# Patient Record
Sex: Female | Born: 1935 | Race: Black or African American | Hispanic: No
Health system: Southern US, Community
[De-identification: ages and names within clinical notes are randomized; demographics above are authoritative.]

---

## 1998-08-06 ENCOUNTER — Observation Stay (HOSPITAL_COMMUNITY): Admission: EM | Admit: 1998-08-06 | Discharge: 1998-08-07 | Payer: Self-pay | Admitting: *Deleted

## 2001-03-26 ENCOUNTER — Ambulatory Visit (HOSPITAL_COMMUNITY): Admission: RE | Admit: 2001-03-26 | Discharge: 2001-03-26 | Payer: Self-pay | Admitting: Family Medicine

## 2001-03-26 ENCOUNTER — Encounter: Payer: Self-pay | Admitting: Family Medicine

## 2001-05-24 ENCOUNTER — Ambulatory Visit (HOSPITAL_COMMUNITY): Admission: RE | Admit: 2001-05-24 | Discharge: 2001-05-24 | Payer: Self-pay | Admitting: Family Medicine

## 2001-05-24 ENCOUNTER — Encounter: Payer: Self-pay | Admitting: Family Medicine

## 2002-08-09 ENCOUNTER — Emergency Department (HOSPITAL_COMMUNITY): Admission: EM | Admit: 2002-08-09 | Discharge: 2002-08-09 | Payer: Self-pay | Admitting: *Deleted

## 2002-09-16 ENCOUNTER — Encounter: Payer: Self-pay | Admitting: Family Medicine

## 2002-09-16 ENCOUNTER — Ambulatory Visit (HOSPITAL_COMMUNITY): Admission: RE | Admit: 2002-09-16 | Discharge: 2002-09-16 | Payer: Self-pay | Admitting: Family Medicine

## 2002-09-22 ENCOUNTER — Inpatient Hospital Stay (HOSPITAL_COMMUNITY): Admission: AD | Admit: 2002-09-22 | Discharge: 2002-10-01 | Payer: Self-pay | Admitting: Family Medicine

## 2002-09-22 ENCOUNTER — Encounter: Payer: Self-pay | Admitting: Family Medicine

## 2002-10-21 ENCOUNTER — Encounter (HOSPITAL_COMMUNITY): Admission: RE | Admit: 2002-10-21 | Discharge: 2002-11-20 | Payer: Self-pay | Admitting: Family Medicine

## 2002-11-20 ENCOUNTER — Encounter (HOSPITAL_COMMUNITY): Admission: RE | Admit: 2002-11-20 | Discharge: 2002-12-20 | Payer: Self-pay | Admitting: Family Medicine

## 2002-12-26 ENCOUNTER — Ambulatory Visit (HOSPITAL_COMMUNITY): Admission: RE | Admit: 2002-12-26 | Discharge: 2002-12-26 | Payer: Self-pay | Admitting: *Deleted

## 2002-12-30 ENCOUNTER — Ambulatory Visit (HOSPITAL_COMMUNITY): Admission: RE | Admit: 2002-12-30 | Discharge: 2002-12-31 | Payer: Self-pay | Admitting: Internal Medicine

## 2003-01-27 ENCOUNTER — Emergency Department (HOSPITAL_COMMUNITY): Admission: EM | Admit: 2003-01-27 | Discharge: 2003-01-27 | Payer: Self-pay | Admitting: *Deleted

## 2003-02-18 ENCOUNTER — Encounter: Admission: RE | Admit: 2003-02-18 | Discharge: 2003-05-19 | Payer: Self-pay | Admitting: Family Medicine

## 2005-04-27 ENCOUNTER — Ambulatory Visit (HOSPITAL_COMMUNITY): Admission: RE | Admit: 2005-04-27 | Discharge: 2005-04-27 | Payer: Self-pay | Admitting: Family Medicine

## 2005-10-06 ENCOUNTER — Ambulatory Visit: Payer: Self-pay | Admitting: *Deleted

## 2005-10-16 ENCOUNTER — Ambulatory Visit: Payer: Self-pay | Admitting: *Deleted

## 2005-10-16 ENCOUNTER — Encounter (HOSPITAL_COMMUNITY): Admission: RE | Admit: 2005-10-16 | Discharge: 2005-11-15 | Payer: Self-pay | Admitting: *Deleted

## 2005-10-18 ENCOUNTER — Ambulatory Visit: Payer: Self-pay | Admitting: *Deleted

## 2006-04-23 ENCOUNTER — Ambulatory Visit (HOSPITAL_COMMUNITY): Admission: RE | Admit: 2006-04-23 | Discharge: 2006-04-23 | Payer: Self-pay | Admitting: Family Medicine

## 2006-04-28 ENCOUNTER — Inpatient Hospital Stay (HOSPITAL_COMMUNITY): Admission: EM | Admit: 2006-04-28 | Discharge: 2006-05-14 | Payer: Self-pay | Admitting: Emergency Medicine

## 2006-04-28 ENCOUNTER — Ambulatory Visit: Payer: Self-pay | Admitting: Cardiology

## 2006-05-16 ENCOUNTER — Encounter (HOSPITAL_COMMUNITY): Admission: RE | Admit: 2006-05-16 | Discharge: 2006-06-15 | Payer: Self-pay | Admitting: Orthopaedic Surgery

## 2006-12-03 ENCOUNTER — Other Ambulatory Visit: Admission: RE | Admit: 2006-12-03 | Discharge: 2006-12-03 | Payer: Self-pay | Admitting: *Deleted

## 2006-12-03 ENCOUNTER — Encounter (INDEPENDENT_AMBULATORY_CARE_PROVIDER_SITE_OTHER): Payer: Self-pay | Admitting: *Deleted

## 2007-03-13 ENCOUNTER — Other Ambulatory Visit: Admission: RE | Admit: 2007-03-13 | Discharge: 2007-03-13 | Payer: Self-pay | Admitting: Family Medicine

## 2007-05-08 ENCOUNTER — Ambulatory Visit: Payer: Self-pay | Admitting: Cardiology

## 2007-06-07 ENCOUNTER — Ambulatory Visit: Payer: Self-pay | Admitting: Cardiology

## 2007-06-28 ENCOUNTER — Encounter (INDEPENDENT_AMBULATORY_CARE_PROVIDER_SITE_OTHER): Payer: Self-pay | Admitting: Obstetrics and Gynecology

## 2007-06-28 ENCOUNTER — Ambulatory Visit (HOSPITAL_COMMUNITY): Admission: RE | Admit: 2007-06-28 | Discharge: 2007-06-28 | Payer: Self-pay | Admitting: Obstetrics and Gynecology

## 2007-08-19 ENCOUNTER — Inpatient Hospital Stay (HOSPITAL_COMMUNITY): Admission: EM | Admit: 2007-08-19 | Discharge: 2007-08-23 | Payer: Self-pay | Admitting: Emergency Medicine

## 2007-10-07 ENCOUNTER — Ambulatory Visit: Payer: Self-pay | Admitting: Cardiology

## 2007-10-07 ENCOUNTER — Inpatient Hospital Stay (HOSPITAL_COMMUNITY): Admission: EM | Admit: 2007-10-07 | Discharge: 2007-10-14 | Payer: Self-pay | Admitting: Emergency Medicine

## 2007-10-08 ENCOUNTER — Encounter: Payer: Self-pay | Admitting: Cardiology

## 2007-10-23 ENCOUNTER — Ambulatory Visit: Payer: Self-pay | Admitting: Cardiology

## 2007-11-03 ENCOUNTER — Inpatient Hospital Stay (HOSPITAL_COMMUNITY): Admission: EM | Admit: 2007-11-03 | Discharge: 2007-11-08 | Payer: Self-pay | Admitting: Emergency Medicine

## 2007-11-03 ENCOUNTER — Ambulatory Visit: Payer: Self-pay | Admitting: Cardiology

## 2007-11-27 ENCOUNTER — Inpatient Hospital Stay (HOSPITAL_COMMUNITY): Admission: EM | Admit: 2007-11-27 | Discharge: 2007-11-29 | Payer: Self-pay | Admitting: Emergency Medicine

## 2007-11-27 ENCOUNTER — Ambulatory Visit: Payer: Self-pay | Admitting: Cardiology

## 2007-12-18 ENCOUNTER — Ambulatory Visit (HOSPITAL_COMMUNITY): Admission: RE | Admit: 2007-12-18 | Discharge: 2007-12-18 | Payer: Self-pay | Admitting: Cardiology

## 2007-12-18 ENCOUNTER — Ambulatory Visit: Payer: Self-pay | Admitting: Cardiology

## 2008-02-24 ENCOUNTER — Inpatient Hospital Stay (HOSPITAL_COMMUNITY): Admission: EM | Admit: 2008-02-24 | Discharge: 2008-02-27 | Payer: Self-pay | Admitting: Emergency Medicine

## 2008-02-24 ENCOUNTER — Ambulatory Visit: Payer: Self-pay | Admitting: Internal Medicine

## 2008-03-17 ENCOUNTER — Ambulatory Visit: Payer: Self-pay | Admitting: *Deleted

## 2008-03-18 ENCOUNTER — Inpatient Hospital Stay (HOSPITAL_COMMUNITY): Admission: EM | Admit: 2008-03-18 | Discharge: 2008-03-18 | Payer: Self-pay | Admitting: Emergency Medicine

## 2008-03-21 ENCOUNTER — Emergency Department (HOSPITAL_COMMUNITY): Admission: EM | Admit: 2008-03-21 | Discharge: 2008-03-21 | Payer: Self-pay | Admitting: Emergency Medicine

## 2008-03-26 ENCOUNTER — Ambulatory Visit: Payer: Self-pay

## 2008-03-30 ENCOUNTER — Ambulatory Visit: Payer: Self-pay | Admitting: Cardiology

## 2008-05-20 ENCOUNTER — Ambulatory Visit: Payer: Self-pay | Admitting: Cardiology

## 2008-05-20 ENCOUNTER — Inpatient Hospital Stay (HOSPITAL_COMMUNITY): Admission: EM | Admit: 2008-05-20 | Discharge: 2008-05-28 | Payer: Self-pay | Admitting: Emergency Medicine

## 2008-05-22 ENCOUNTER — Encounter: Payer: Self-pay | Admitting: Internal Medicine

## 2008-06-09 ENCOUNTER — Ambulatory Visit: Payer: Self-pay | Admitting: Cardiology

## 2008-06-26 ENCOUNTER — Ambulatory Visit: Payer: Self-pay | Admitting: Internal Medicine

## 2008-06-26 ENCOUNTER — Inpatient Hospital Stay (HOSPITAL_COMMUNITY): Admission: EM | Admit: 2008-06-26 | Discharge: 2008-07-22 | Payer: Self-pay | Admitting: Emergency Medicine

## 2008-06-29 DIAGNOSIS — E109 Type 1 diabetes mellitus without complications: Secondary | ICD-10-CM | POA: Insufficient documentation

## 2008-06-29 DIAGNOSIS — I10 Essential (primary) hypertension: Secondary | ICD-10-CM | POA: Insufficient documentation

## 2008-06-29 DIAGNOSIS — F068 Other specified mental disorders due to known physiological condition: Secondary | ICD-10-CM | POA: Insufficient documentation

## 2008-06-29 DIAGNOSIS — E785 Hyperlipidemia, unspecified: Secondary | ICD-10-CM | POA: Insufficient documentation

## 2008-06-29 DIAGNOSIS — E039 Hypothyroidism, unspecified: Secondary | ICD-10-CM | POA: Insufficient documentation

## 2008-06-29 DIAGNOSIS — M1990 Unspecified osteoarthritis, unspecified site: Secondary | ICD-10-CM | POA: Insufficient documentation

## 2008-07-01 ENCOUNTER — Encounter (INDEPENDENT_AMBULATORY_CARE_PROVIDER_SITE_OTHER): Payer: Self-pay | Admitting: Internal Medicine

## 2008-07-01 ENCOUNTER — Ambulatory Visit: Payer: Self-pay | Admitting: Vascular Surgery

## 2008-08-14 ENCOUNTER — Ambulatory Visit: Payer: Self-pay | Admitting: Cardiology

## 2008-09-02 ENCOUNTER — Inpatient Hospital Stay (HOSPITAL_COMMUNITY): Admission: RE | Admit: 2008-09-02 | Discharge: 2008-09-09 | Payer: Self-pay | Admitting: General Surgery

## 2008-09-03 ENCOUNTER — Encounter (INDEPENDENT_AMBULATORY_CARE_PROVIDER_SITE_OTHER): Payer: Self-pay | Admitting: General Surgery

## 2008-09-28 ENCOUNTER — Ambulatory Visit: Payer: Self-pay | Admitting: Cardiology

## 2008-09-28 ENCOUNTER — Encounter (INDEPENDENT_AMBULATORY_CARE_PROVIDER_SITE_OTHER): Payer: Self-pay | Admitting: *Deleted

## 2008-09-28 LAB — CONVERTED CEMR LAB
Alkaline Phosphatase: 118 units/L
BUN: 24 mg/dL
CO2: 23 meq/L
Chloride: 104 meq/L
Creatinine, Ser: 1.24 mg/dL
Glucose, Bld: 108 mg/dL
Total Protein: 6.9 g/dL

## 2008-10-20 ENCOUNTER — Inpatient Hospital Stay (HOSPITAL_COMMUNITY): Admission: EM | Admit: 2008-10-20 | Discharge: 2008-10-23 | Payer: Self-pay | Admitting: Emergency Medicine

## 2008-11-16 ENCOUNTER — Emergency Department (HOSPITAL_COMMUNITY): Admission: EM | Admit: 2008-11-16 | Discharge: 2008-11-16 | Payer: Self-pay | Admitting: Emergency Medicine

## 2009-03-08 ENCOUNTER — Other Ambulatory Visit: Payer: Self-pay | Admitting: Emergency Medicine

## 2009-03-09 ENCOUNTER — Inpatient Hospital Stay (HOSPITAL_COMMUNITY): Admission: EM | Admit: 2009-03-09 | Discharge: 2009-03-11 | Payer: Self-pay | Admitting: Emergency Medicine

## 2009-03-09 ENCOUNTER — Ambulatory Visit: Payer: Self-pay | Admitting: Cardiology

## 2009-03-09 ENCOUNTER — Ambulatory Visit: Payer: Self-pay | Admitting: Internal Medicine

## 2009-06-17 ENCOUNTER — Ambulatory Visit: Payer: Self-pay | Admitting: Family Medicine

## 2009-06-17 ENCOUNTER — Inpatient Hospital Stay (HOSPITAL_COMMUNITY): Admission: EM | Admit: 2009-06-17 | Discharge: 2009-06-20 | Payer: Self-pay | Admitting: Emergency Medicine

## 2009-06-17 ENCOUNTER — Ambulatory Visit: Payer: Self-pay | Admitting: Cardiology

## 2009-06-18 ENCOUNTER — Encounter: Payer: Self-pay | Admitting: Family Medicine

## 2009-06-18 ENCOUNTER — Encounter (INDEPENDENT_AMBULATORY_CARE_PROVIDER_SITE_OTHER): Payer: Self-pay | Admitting: *Deleted

## 2009-06-18 LAB — CONVERTED CEMR LAB
Hgb A1c MFr Bld: 7.3 %
TSH: 11.713 microintl units/mL

## 2009-06-24 ENCOUNTER — Encounter (INDEPENDENT_AMBULATORY_CARE_PROVIDER_SITE_OTHER): Payer: Self-pay | Admitting: *Deleted

## 2009-06-28 ENCOUNTER — Ambulatory Visit: Payer: Self-pay | Admitting: Cardiology

## 2009-06-28 DIAGNOSIS — I4891 Unspecified atrial fibrillation: Secondary | ICD-10-CM | POA: Insufficient documentation

## 2009-06-28 DIAGNOSIS — I739 Peripheral vascular disease, unspecified: Secondary | ICD-10-CM | POA: Insufficient documentation

## 2009-07-05 ENCOUNTER — Ambulatory Visit (HOSPITAL_COMMUNITY): Admission: RE | Admit: 2009-07-05 | Discharge: 2009-07-05 | Payer: Self-pay | Admitting: Cardiology

## 2009-07-18 IMAGING — CR DG CHEST 1V PORT
1 series · 1 of 1 positions shown · non-contrast
Comparison: 07/01/2008

CLINICAL DATA: Right-sided PICC line placement.  Diabetes.

PORTABLE CHEST - 1 VIEW

[view not recorded]
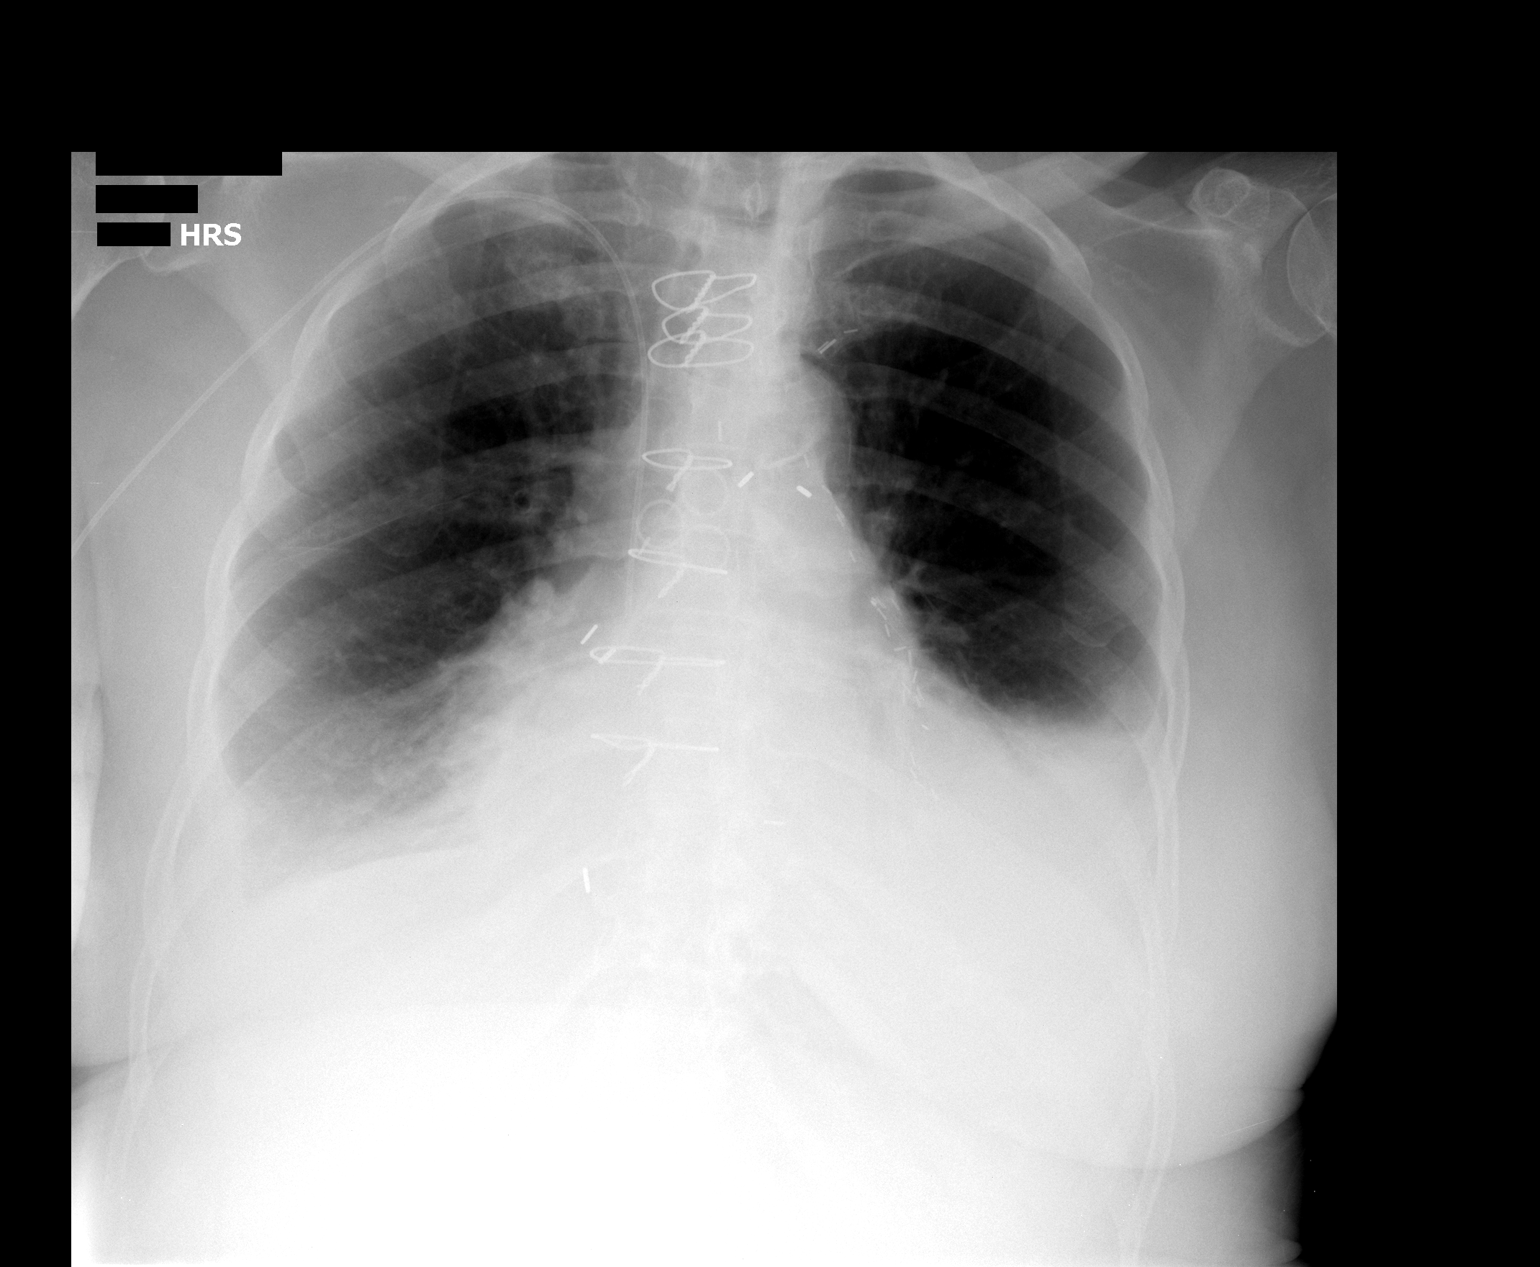

[1 of 1 positions shown; findings below may reference images not displayed]

FINDINGS: Moderate left and small right pleural effusions are
present with associated passive atelectasis.  Prior CABG noted with
cardiomegaly.  No overt edema is present at this time.

Right-sided PICC line noted with tip projecting over the superior
vena cava.

There is atherosclerosis of the aortic arch.
IMPRESSION: 1.  Right-sided PICC line tip:  SVC.  No pneumothorax.
2.  Moderate left and small right pleural effusions with passive
atelectasis.
3.  Cardiomegaly but no current pulmonary edema identified.

## 2009-07-23 ENCOUNTER — Encounter (INDEPENDENT_AMBULATORY_CARE_PROVIDER_SITE_OTHER): Payer: Self-pay | Admitting: *Deleted

## 2009-07-23 LAB — CONVERTED CEMR LAB
Hemoglobin: 9.4 g/dL
Hemoglobin: 9.4 g/dL
MCV: 92 fL
Platelets: 283 10*3/uL
Platelets: 283 10*3/uL
TSH: 4.32 microintl units/mL
WBC: 3.2 10*3/uL

## 2009-08-30 ENCOUNTER — Ambulatory Visit: Payer: Self-pay | Admitting: Cardiology

## 2009-08-31 ENCOUNTER — Encounter: Payer: Self-pay | Admitting: Cardiology

## 2009-09-07 ENCOUNTER — Inpatient Hospital Stay (HOSPITAL_COMMUNITY): Admission: EM | Admit: 2009-09-07 | Discharge: 2009-09-10 | Payer: Self-pay | Admitting: Emergency Medicine

## 2009-09-22 ENCOUNTER — Ambulatory Visit: Payer: Self-pay | Admitting: Gastroenterology

## 2009-09-30 ENCOUNTER — Encounter (INDEPENDENT_AMBULATORY_CARE_PROVIDER_SITE_OTHER): Payer: Self-pay | Admitting: Internal Medicine

## 2009-09-30 ENCOUNTER — Ambulatory Visit: Payer: Self-pay | Admitting: Cardiology

## 2009-11-14 DEATH — deceased

## 2010-06-23 ENCOUNTER — Inpatient Hospital Stay (HOSPITAL_COMMUNITY): Admission: EM | Admit: 2010-06-23 | Discharge: 2009-10-15 | Payer: Self-pay | Admitting: Emergency Medicine

## 2010-06-23 ENCOUNTER — Encounter (INDEPENDENT_AMBULATORY_CARE_PROVIDER_SITE_OTHER): Payer: Self-pay | Admitting: *Deleted

## 2010-07-04 IMAGING — US US CAROTID DUPLEX BILAT
1 series · 13 of 24 positions shown · non-contrast
Comparison: None.

CLINICAL DATA: Hypertension, diabetes, heart disease status post MI
and CABG, carotid bruit

BILATERAL CAROTID DUPLEX ULTRASOUND
TECHNIQUE: Gray scale imaging, color Doppler and duplex ultrasound
was performed of bilateral carotid and vertebral arteries in the
neck.

[Series 1: unknown · 0.07mm/px · 13 of 60 slices shown]
[im 1/60]
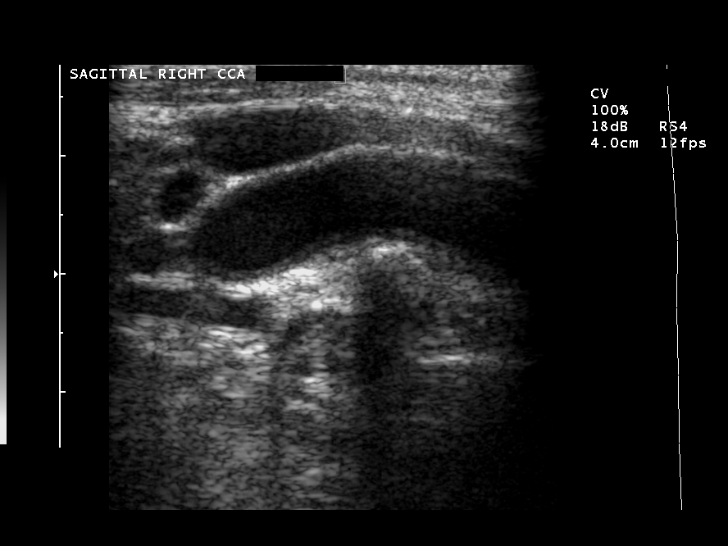
[im 6/60]
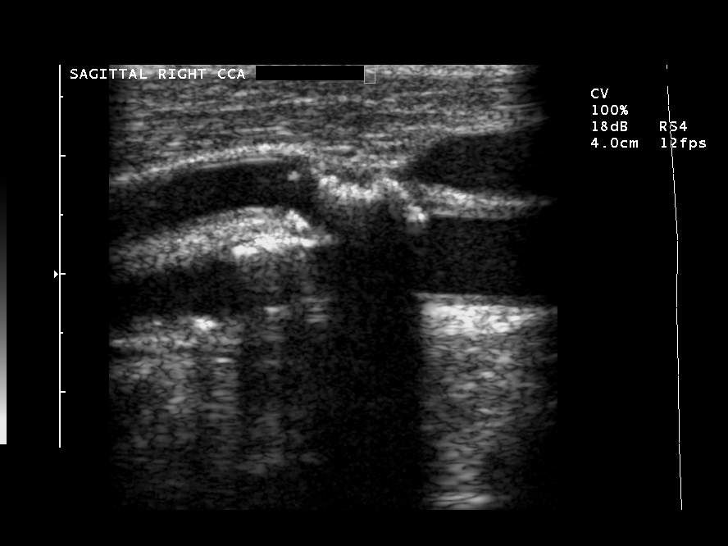
[im 11/60]
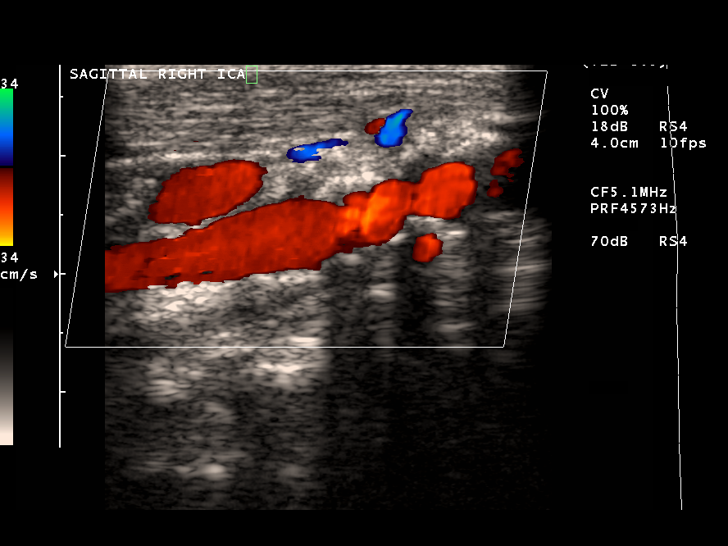
[im 16/60]
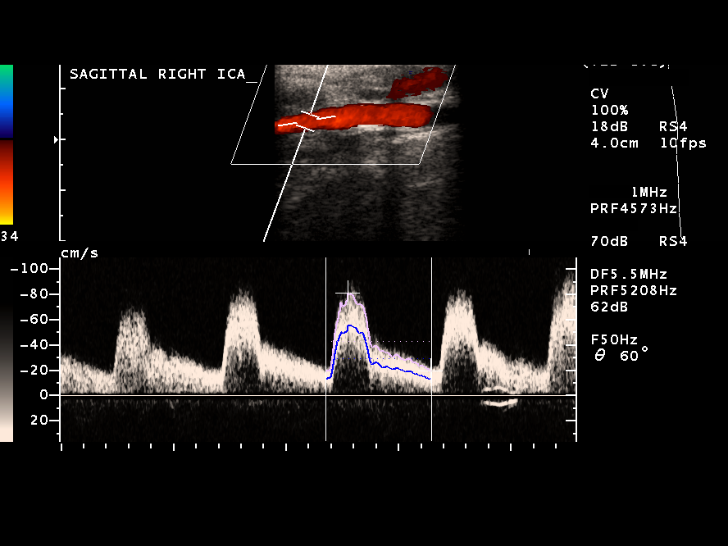
[im 21/60]
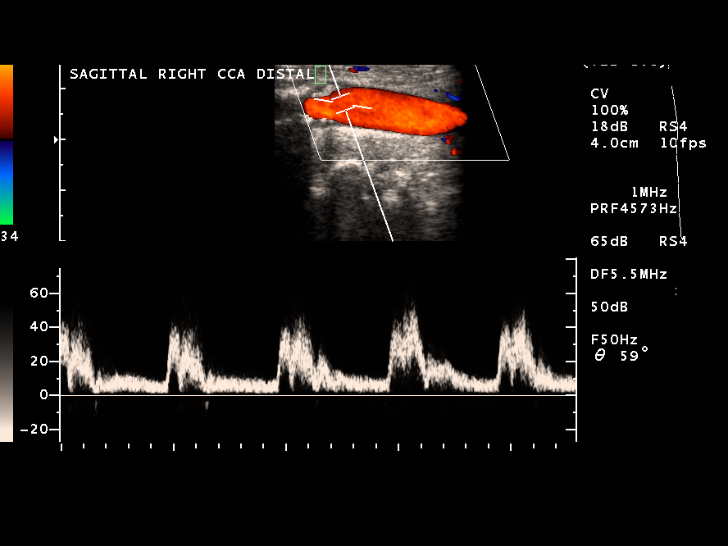
[im 26/60]
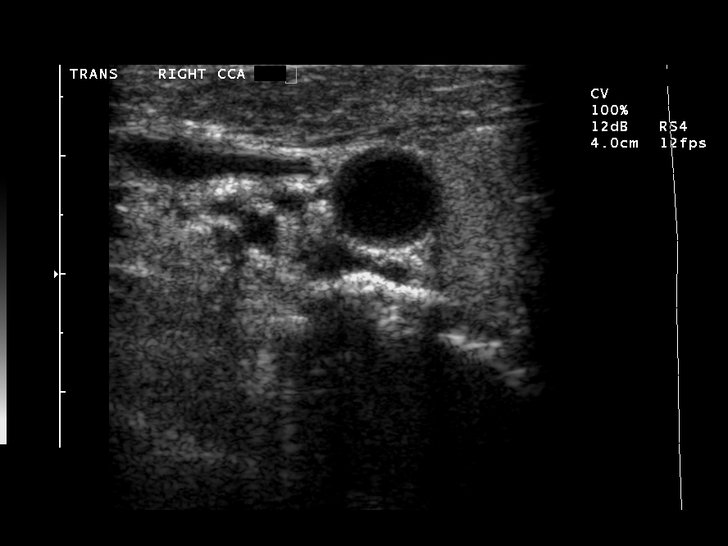
[im 31/60]
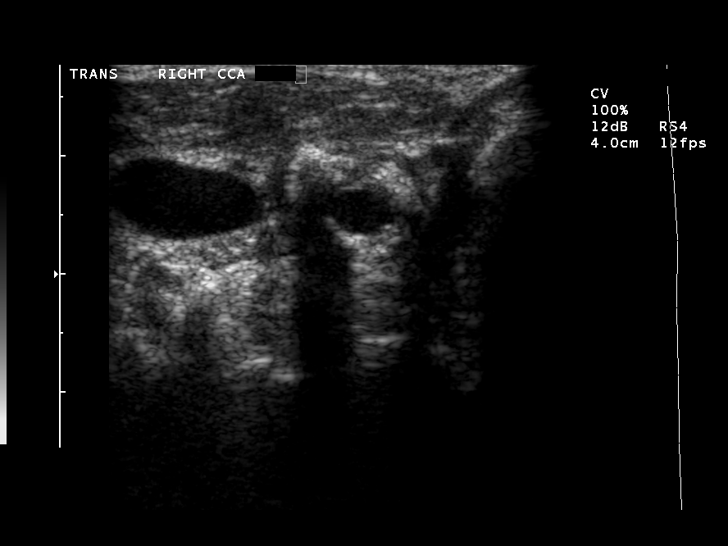
[im 34/60]
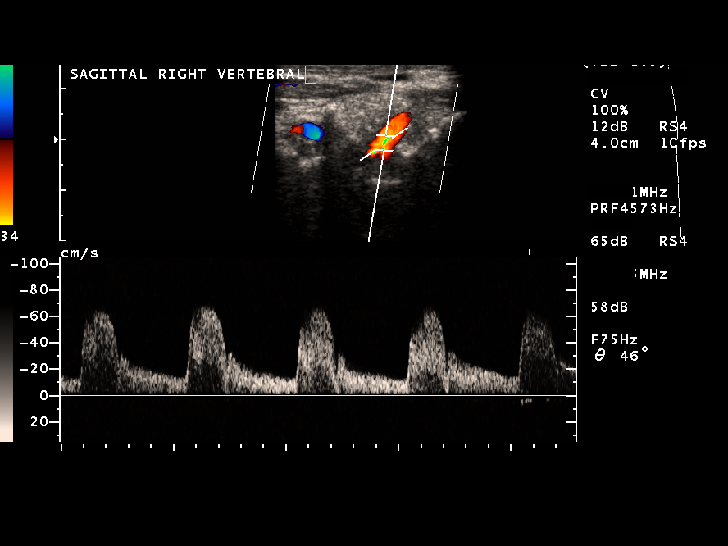
[im 39/60]
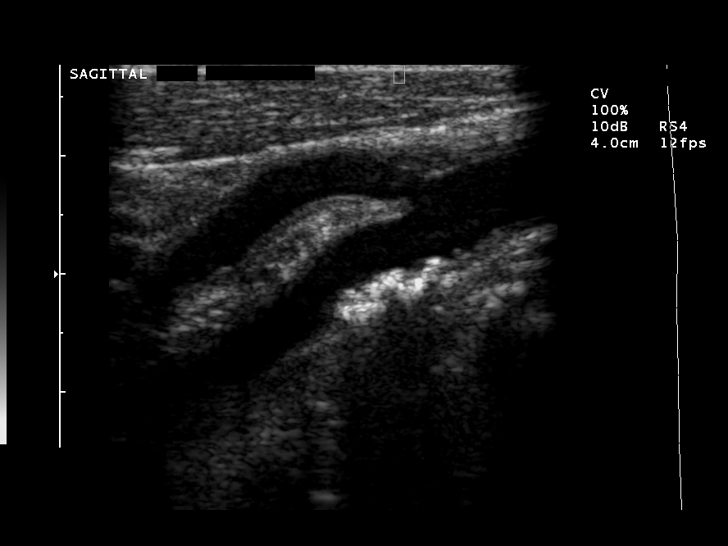
[im 44/60]
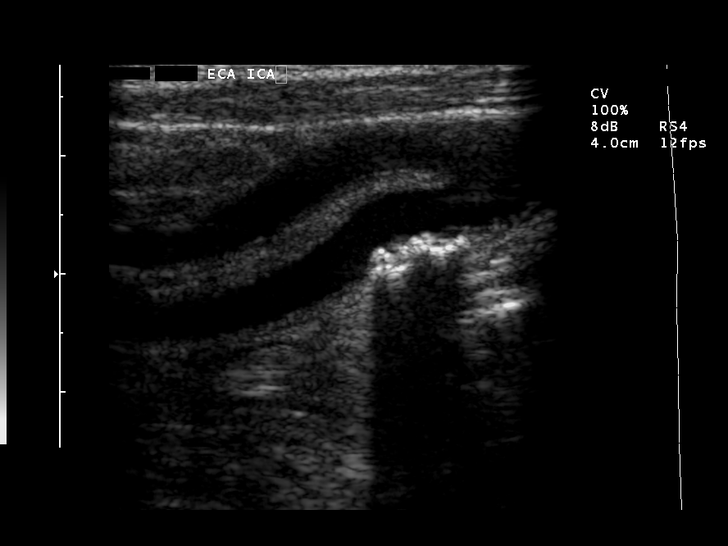
[im 49/60]
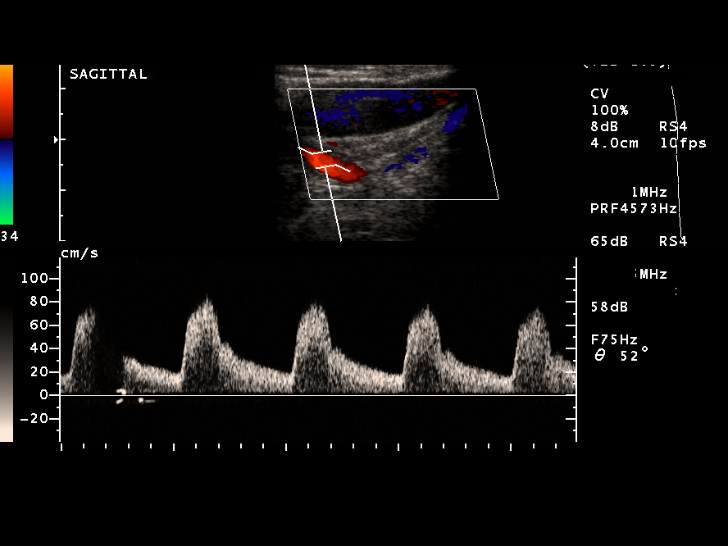
[im 54/60]
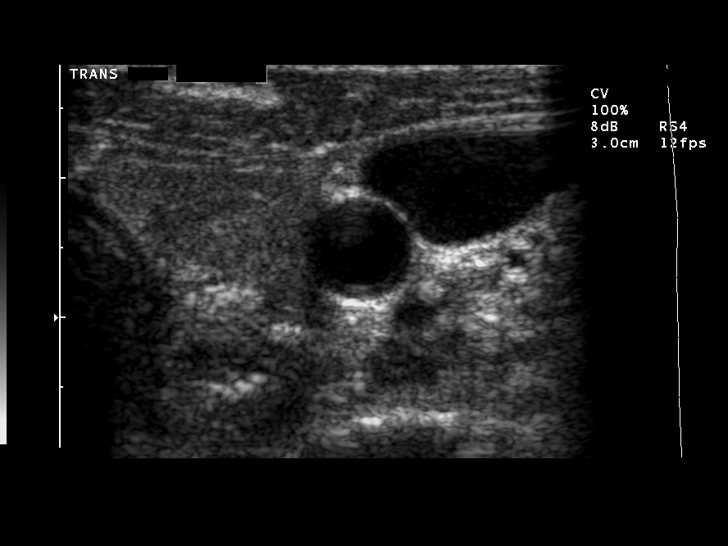
[im 60/60]
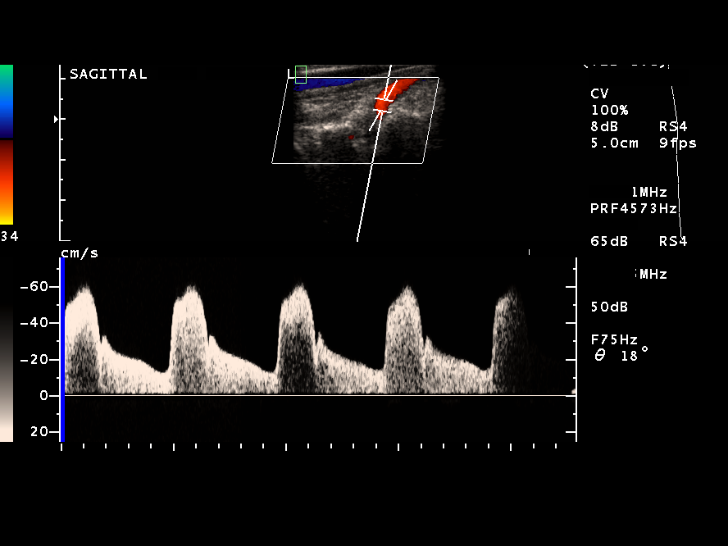

[13 of 24 positions shown; findings below may reference images not displayed]

Criteria:  Quantification of carotid stenosis is based on velocity
parameters that correlate the residual internal carotid diameter
with NASCET-based stenosis levels, using the diameter of the distal
internal carotid lumen as the denominator for stenosis measurement.

The following velocity measurements were obtained:

                 PEAK SYSTOLIC/END DIASTOLIC
RIGHT
ICA:                        84/19cm/sec
CCA:                        54/9cm/sec
SYSTOLIC ICA/CCA RATIO:
DIASTOLIC ICA/CCA RATIO:
ECA:                        128/4cm/sec

LEFT
ICA:                        73/17cm/sec
CCA:                        66/10cm/sec
SYSTOLIC ICA/CCA RATIO:
DIASTOLIC ICA/CCA RATIO:
ECA:                        94/5cm/sec
FINDINGS: RIGHT CAROTID ARTERY: Tortuous right CCA.  Significant plaque at
right carotid bulb into proximal right ECA and ICA, with a
significant portion of this plaque calcified and shadowing.
Turbulent flow identified in proximal right ECA with increased
velocity on color Doppler imaging.  Spectral broadening right ECA
on waveform analysis.  Less turbulence identified in right ICA,
though significant spectral broadening is noted.  No definite high
velocity jet within the right ICA. Gray scale estimation of the
proximal right ICA shows less than 50% diameter narrowing.

RIGHT VERTEBRAL ARTERY:  Patent, antegrade

LEFT CAROTID ARTERY: Mild plaque formation identified left CCA,
left carotid bulb extending into the left internal carotid.
Proximal left ICA plaque is shadowing but does not obscure the
vessel.  Turbulent flow identified within the proximal left ICA and
ECA, both of which are minimally tortuous.  Mild spectral
broadening distal left ICA, noted in an area of tortuosity.  No
definite high velocity jets.

LEFT VERTEBRAL ARTERY:  Patent, antegrade
IMPRESSION: Plaque formation bilaterally at the carotid bifurcations,
significant portions of which are calcified, with velocities
corresponding to a less than 50% diameter stenoses.
Turbulent and increased velocity flow in the proximal right ECA
suggests mild stenosis.
No definite hemodynamically significant common carotid or internal
carotid artery stenosis identified.

## 2010-08-07 ENCOUNTER — Encounter: Payer: Self-pay | Admitting: Cardiology

## 2010-08-07 ENCOUNTER — Encounter: Payer: Self-pay | Admitting: *Deleted

## 2010-08-16 NOTE — Letter (Signed)
Summary: Appointment - Reminder 2  [facility] [place]. [address], [place] [address]   Phone: [phone]  Fax: [phone]     June 23, 2010 MRN: [id number]   [name] [address] [place], [place]  [address]   Dear Ms. Kasa,  Our records indicate that it is time to schedule a follow-up appointment.  Dr. Dietrich Pates         recommended that you follow up with [place] in   01/2010 PAST DUE         . It is very important that we reach you to schedule this appointment. We look forward to participating in your health care needs. Please contact us at the number listed above at your earliest convenience to schedule your appointment.  If you are unable to make an appointment at this time, give [place] a call so we can update our records.     Sincerely,   Glass blower/designer

## 2010-08-16 NOTE — Letter (Signed)
Summary: BP READINGS  BP READINGS   Imported By: Faythe Ghee 08/31/2009 15:51:37  _____________________________________________________________________  External Attachment:    Type:   Image     Comment:   External Document

## 2010-08-16 NOTE — Assessment & Plan Note (Signed)
Summary: 2 mth nurse visit per checkout on 06/28/09/tg  Nurse Visit   Vital Signs:  Patient profile:   77 year old female Height:      65 inches Weight:      132 pounds O2 Sat:      96 % on Room air Pulse rate:   82 / minute BP sitting:   215 / 100  (right arm)  Vitals Entered By: Teressa Lower RN (August 30, 2009 10:37 AM)  O2 Flow:  Room air  Serial Vital Signs/Assessments:  Time      Position  BP       Pulse  Resp  Temp     By 10:38 AM            212/96   82                    Anjel Perfetti RN   Visit Type:  2 mo. nurse visit Primary Provider:  Dr. Mirna Mires   History of Present Illness: Dyspnea; intermittent chest discomfort Electrocardiogram BP readings from NH  67 readings                                     54 readings  SBP > 150                                    14 readings DBP >85 medications that where d/c'd in NH was done by Dr. Marijo Conception       Current Medications (verified): 1)  Plavix 75 Mg Tabs (Clopidogrel Bisulfate) .... Take 1 By Mouth Qd 2)  Metoprolol Tartrate 25 Mg Tabs (Metoprolol Tartrate) .... Take 1/2  Tablet By Mouth Twice A Day 3)  Namenda 10 Mg Tabs (Memantine Hcl) .... Take 1 Two Times A Day 4)  Synthroid 50 Mcg Tabs (Levothyroxine Sodium) .... Take 1 Tab Daily 5)  Aspir-Low 81 Mg Tbec (Aspirin) .... Take 1 Tab Daily 6)  Ditropan Xl 10 Mg Xr24h-Tab (Oxybutynin Chloride) .... Take 1 Tab Daily 7)  Cardizem Cd 180 Mg Xr24h-Cap (Diltiazem Hcl Coated Beads) .... Take 1 Tab Daily 8)  Ferrous Sulfate 325 (65 Fe) Mg Tabs (Ferrous Sulfate) .... Take 1 Tab Daily 9)  Protonix 40 Mg Tbec (Pantoprazole Sodium) .... Take 1 Tab Daily 10)  Remeron 30 Mg Tabs (Mirtazapine) .... Take 1 Tab At Bedtime 11)  Novolog 100 Unit/ml Soln (Insulin Aspart) .... Sliding Scale 12)  Clonidine Hcl 0.1 Mg Tabs (Clonidine Hcl) .... Take One Tablet By Mouth Two Times A Day Increase To 0.2mg   Two Times A Day If Bp Not Controlled 13)  Aspirin 81 Mg Tbec (Aspirin) ....  Take One Tablet By Mouth Daily 14)  Isosorbide Mononitrate Cr 30 Mg Xr24h-Tab (Isosorbide Mononitrate) .... Take One Tablet By Mouth Daily 15)  Celexa 10 Mg Tabs (Citalopram Hydrobromide) .... Take 3 Tablets By Mouth At Bedtime 16)  Colace 100 Mg Caps (Docusate Sodium) .... Take 1 Tablet By Mouth Two Times A Day 17)  Lantus 100 Unit/ml Soln (Insulin Glargine) .... 5 Units Subcutaneously At Bedtime 18)  Robitussin Dm 100-10 Mg/42ml Syrp (Dextromethorphan-Guaifenesin) .Marland Kitchen.. 10ml As Needed For Cough 19)  Vicodin 5-500 Mg Tabs (Hydrocodone-Acetaminophen) .... As Needed  Allergies (verified): No Known Drug Allergies  Appended Document: 2 mth nurse visit per checkout on 06/28/09/tg  Increase metoprolol to 50 mg b.i.d. Chlorthalidone 12.5 mg once daily Change clonidine to Catapres TTS level II if financially feasible BP check in 3 weeks.  Kalaoa Bing, M.D.  Appended Document: 2 mth nurse visit per checkout on 06/28/09/tg Medications Added METOPROLOL TARTRATE 50 MG TABS (METOPROLOL TARTRATE) take 1 tablet by mouth two times a day CATAPRES-TTS-2 0.2 MG/24HR PTWK (CLONIDINE HCL)  CHLORTHALIDONE 25 MG TABS (CHLORTHALIDONE) take 1/2 tablet by mouth once daily          Phone Note Outgoing Call   Summary of Call: Phone number is out of service, will try to locate an allternate phone number.   Follow-up for Phone Call        Patient now lives at [facility] (phone#= [phone]). I gave verbal orders over the phone and  faxed physician orders to pt's nurse, [facility], at [facility] nursing home @ [phone]. Also, Ive asked Hope to fax [place] pt's BP readings from today until September 20, 2009 along with pt's med list to avoid pt. being transported here for BP check. Nurse, Hope stated she understands info. given. Follow-up by: Larita Fife Via LPN,  September 06, 2009 11:48 AM    New/Updated Medications: METOPROLOL TARTRATE 50 MG TABS (METOPROLOL TARTRATE) take 1 tablet by mouth two times a day CATAPRES-TTS-2  0.2 MG/24HR PTWK (CLONIDINE HCL)  CHLORTHALIDONE 25 MG TABS (CHLORTHALIDONE) take 1/2 tablet by mouth once daily

## 2010-08-16 NOTE — Miscellaneous (Signed)
Summary: cbc,tsh  Clinical Lists Changes  Observations: Added new observation of PLATELETK/UL: 283 K/uL (07/23/2009 10:42) Added new observation of MCV: 92 fL (07/23/2009 10:42) Added new observation of HCT: 29.0 % (07/23/2009 10:42) Added new observation of HGB: 9.4 g/dL (16/04/9603 54:09) Added new observation of WBC COUNT: 3.2 10*3/microliter (07/23/2009 10:42) Added new observation of TSH: 4.320 microintl units/mL (07/23/2009 10:42)

## 2010-09-24 IMAGING — CR DG ABD PORTABLE 1V
1 series · 1 of 1 positions shown · non-contrast
Comparison: 09/24/2009

CLINICAL DATA: Pan colitis

ABDOMEN - 1 VIEW

[AP]
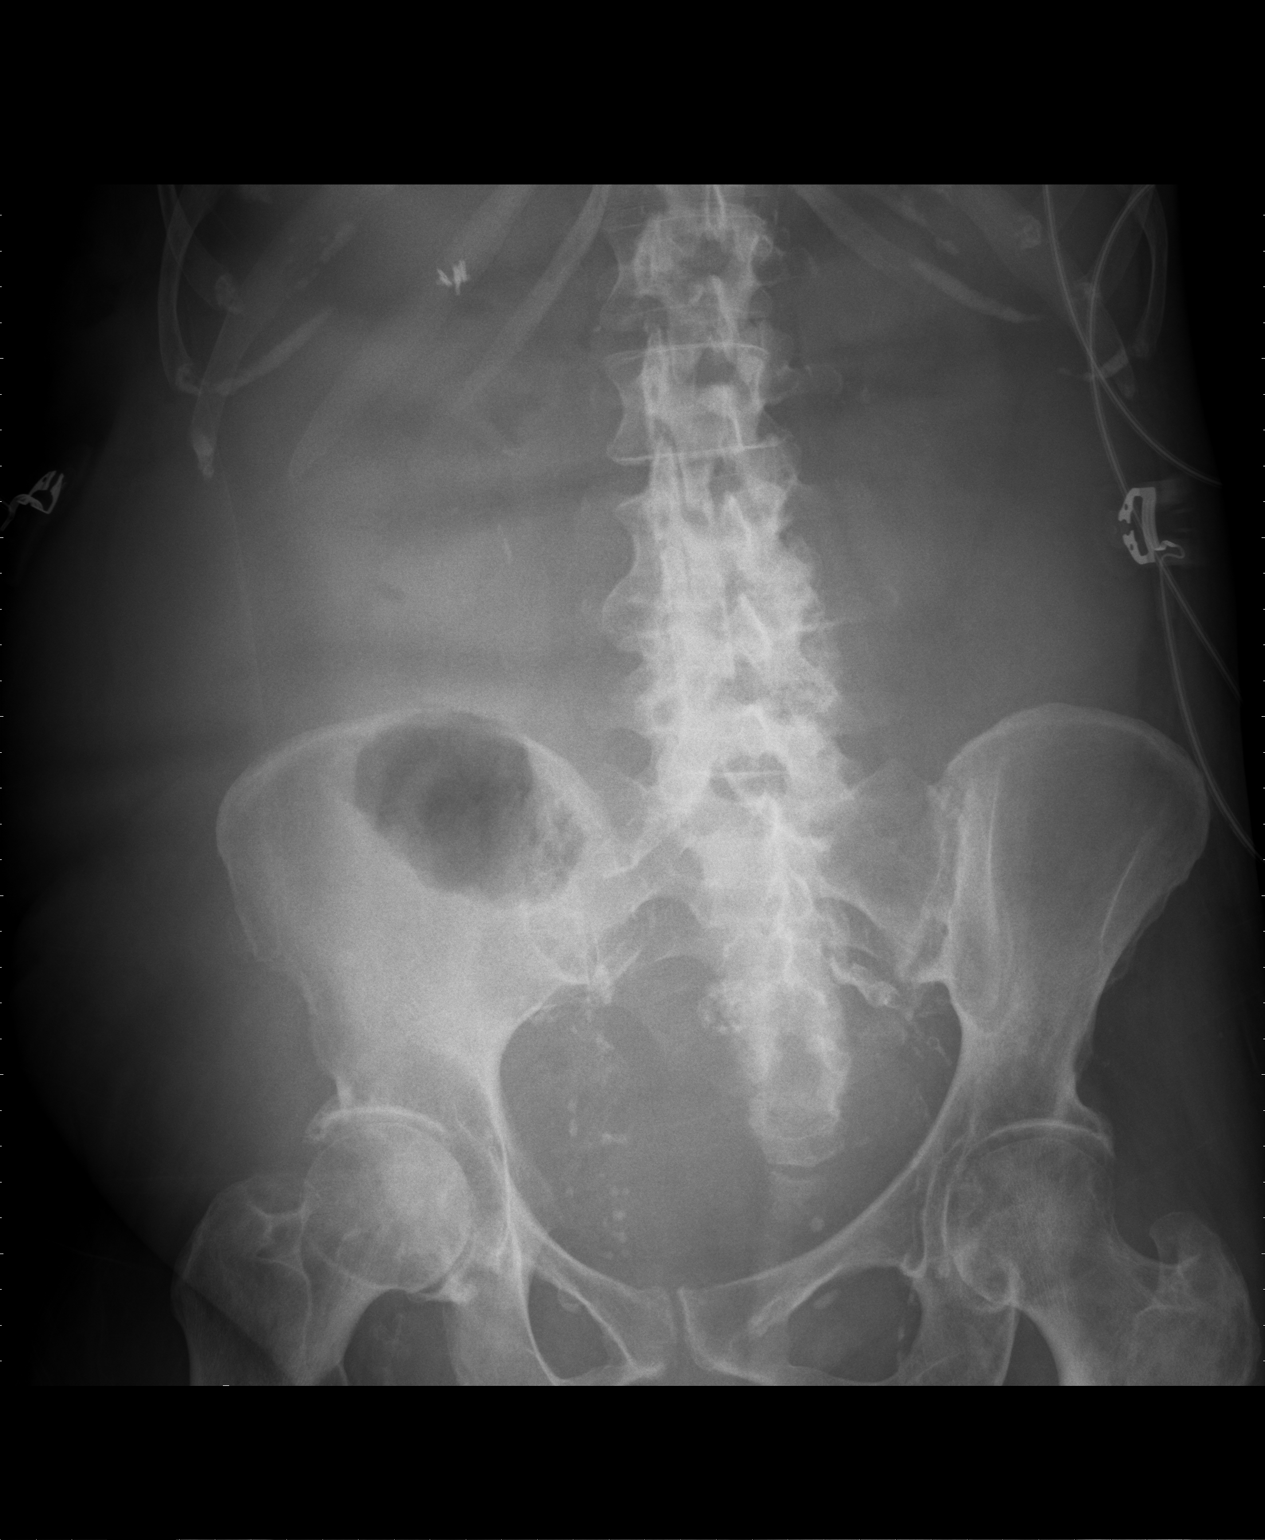

[1 of 1 positions shown; findings below may reference images not displayed]

FINDINGS: Stable appearance of right lower quadrant bowel loop.

There is no evidence for free intraperitoneal air.

Renal calculi and cholecystectomy clips again noted.
IMPRESSION: 1.  No change in the appearance of prominent right lower quadrant
bowel loop

## 2010-09-25 IMAGING — CR DG CHEST 1V PORT
1 series · 1 of 1 positions shown · non-contrast
Comparison: 09/19/2009

CLINICAL DATA: Pain.  Colitis.  The patient is unresponsive.

PORTABLE CHEST - 1 VIEW

[view not recorded]
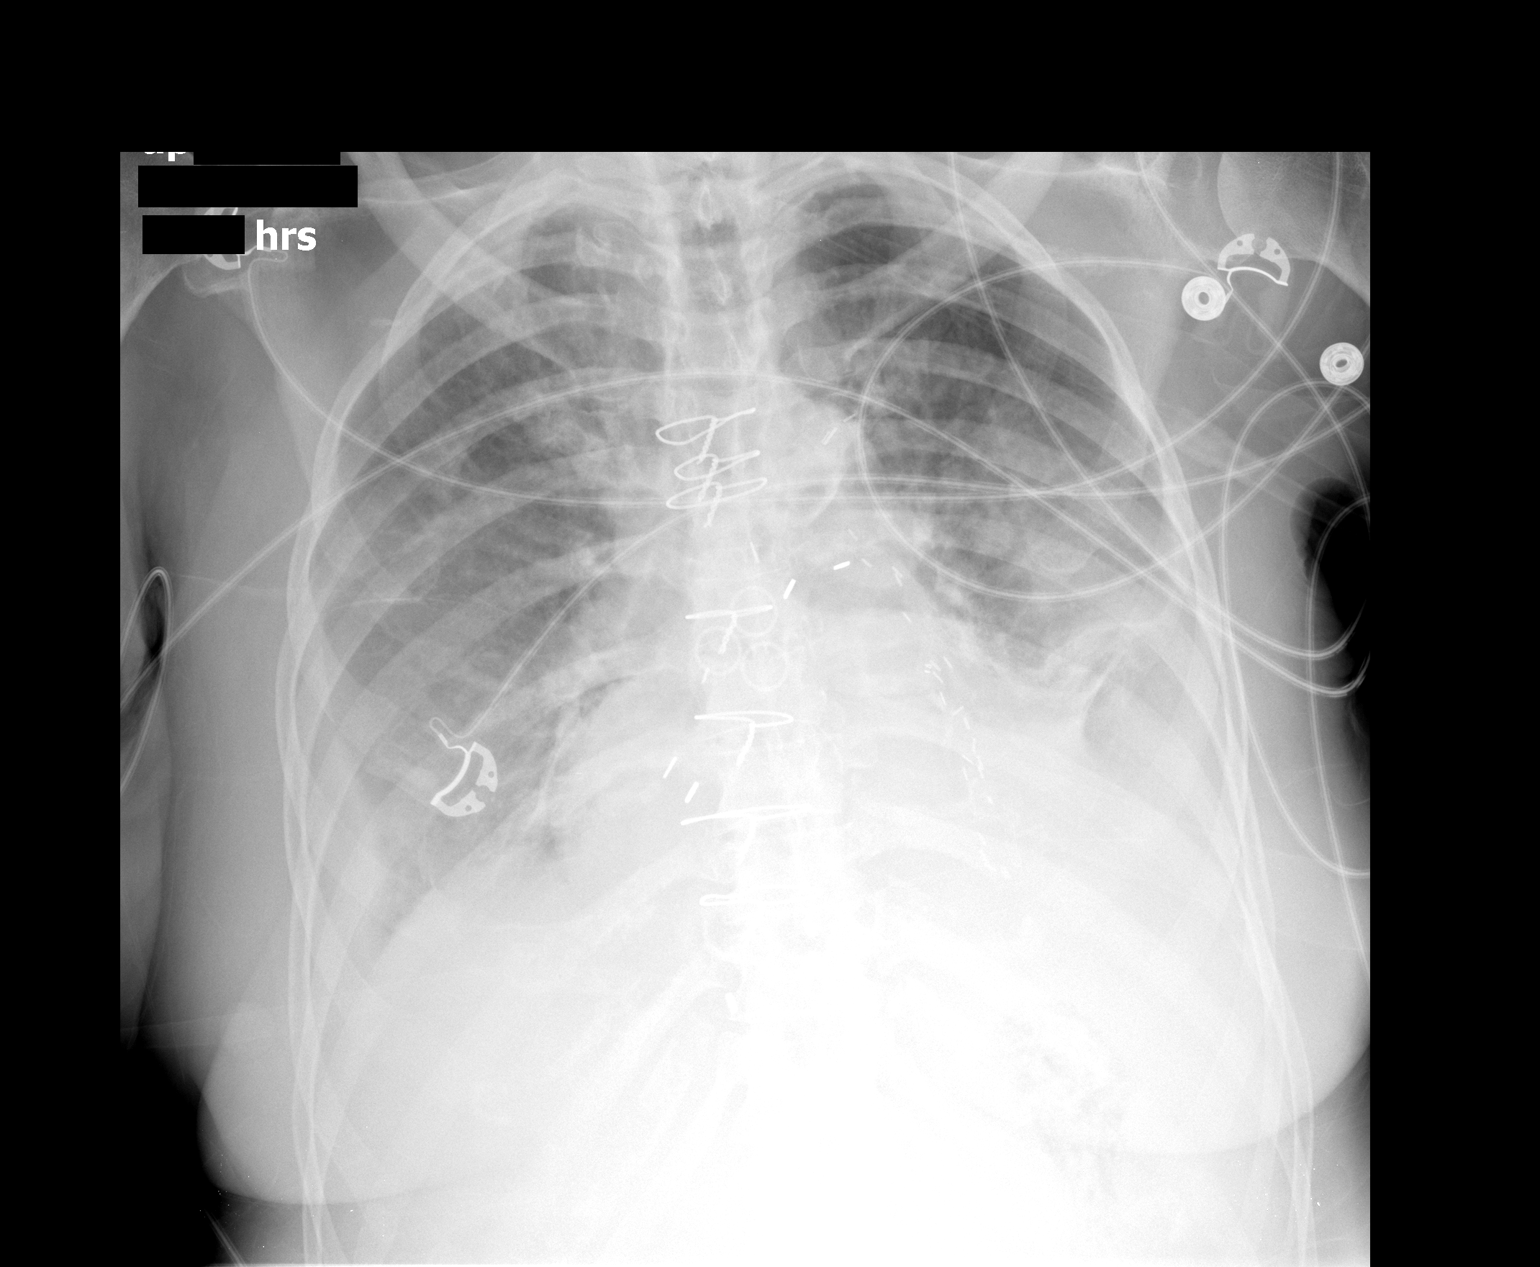

[1 of 1 positions shown; findings below may reference images not displayed]

FINDINGS: Patient has had a median sternotomy and CABG.  Heart is
enlarged.  There is increased pulmonary edema.  There are bilateral
pleural effusions.  Bibasilar opacities have increased since prior
study, consistent with effusions and infiltrates or increased
atelectasis.
IMPRESSION: Increased pulmonary edema and bilateral lower lobe opacities.

## 2010-09-26 IMAGING — CR DG CHEST 1V PORT
1 series · 1 of 1 positions shown · non-contrast
Comparison: 09/26/2009.

CLINICAL DATA: Pneumonia.  Colitis.

PORTABLE CHEST - 1 VIEW

[view not recorded]
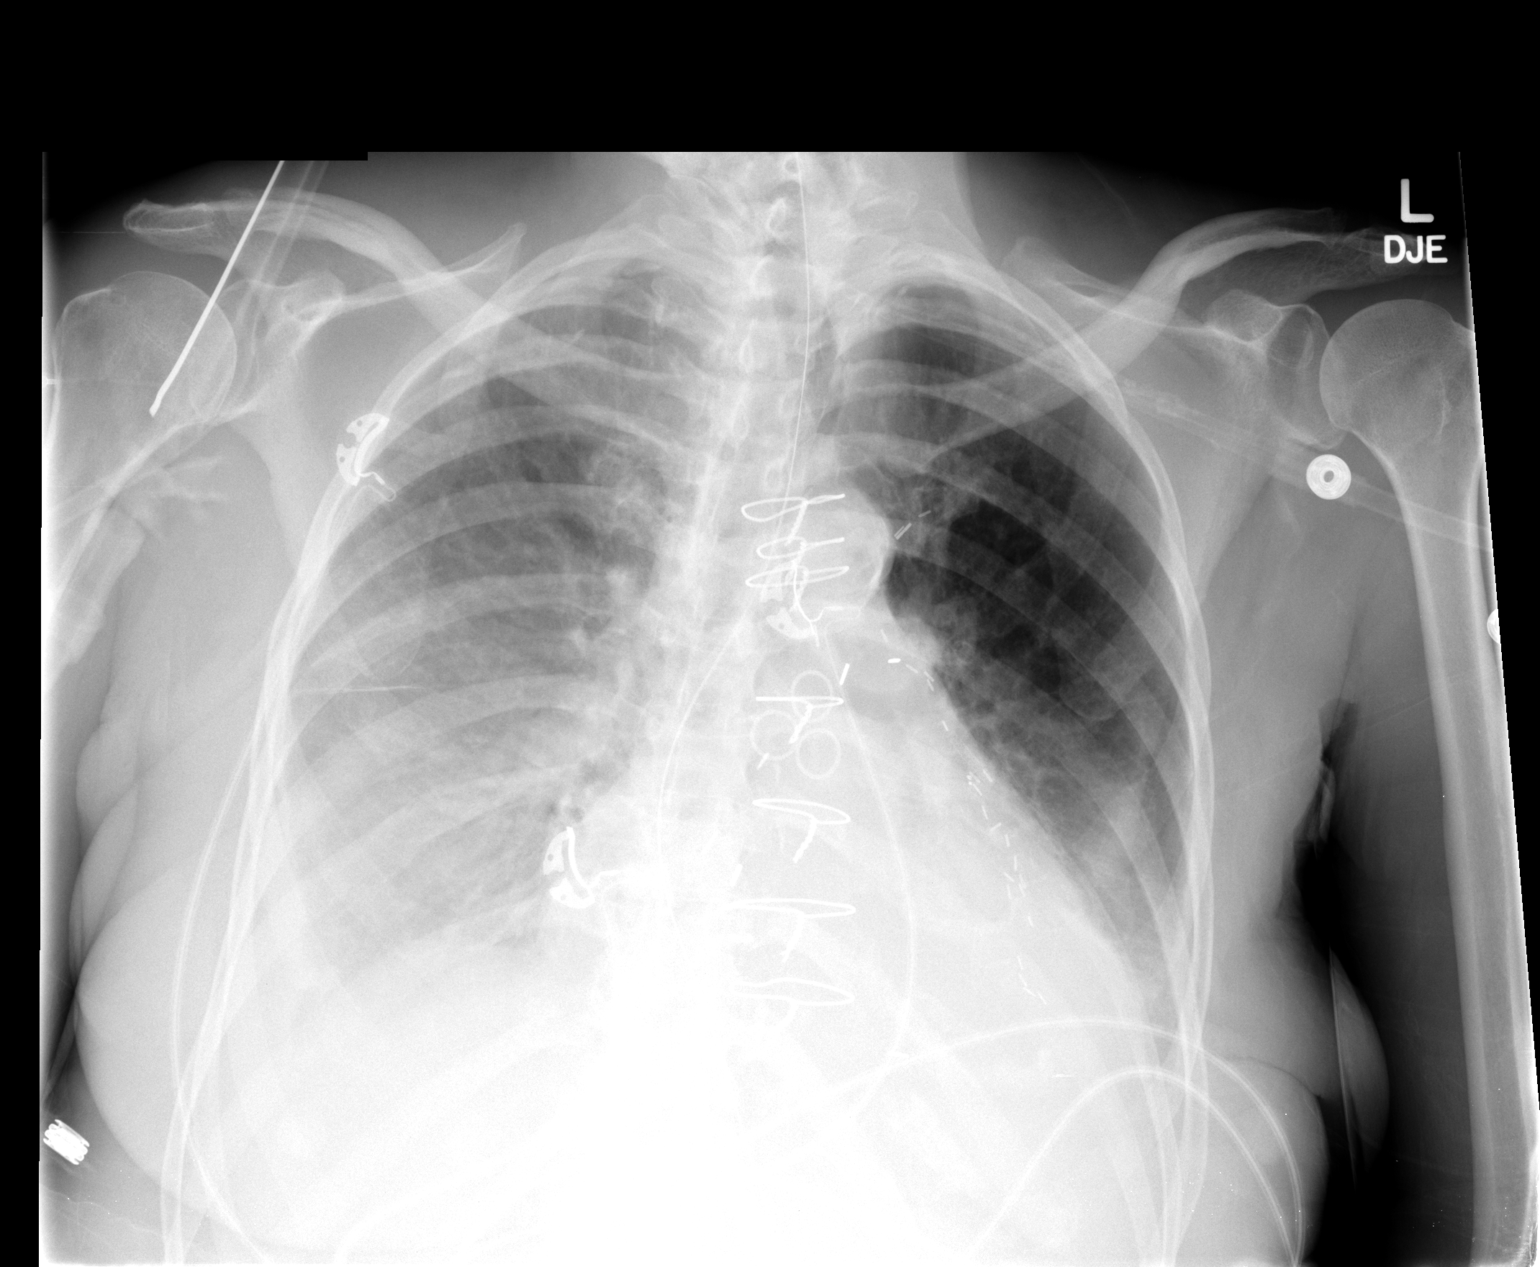

[1 of 1 positions shown; findings below may reference images not displayed]

FINDINGS: Small right pleural effusion again noted.  Pulmonary
vascular congestion.  No frank pulmonary edema.  Left lower lobe
collapse / consolidation is again appreciated.  NG tube is been
inserted and is traversing the esophagus and proximal stomach.
Tube tip is in the proximal stomach.
IMPRESSION: Pulmonary vascular congestion, pleural effusions, and left lower
lobe atelectasis / consolidation are noted.

## 2010-10-05 LAB — COMPREHENSIVE METABOLIC PANEL
Alkaline Phosphatase: 113 U/L (ref 39–117)
Alkaline Phosphatase: 92 U/L (ref 39–117)
BUN: 27 mg/dL — ABNORMAL HIGH (ref 6–23)
BUN: 30 mg/dL — ABNORMAL HIGH (ref 6–23)
CO2: 22 mEq/L (ref 19–32)
Calcium: 8.2 mg/dL — ABNORMAL LOW (ref 8.4–10.5)
Chloride: 103 mEq/L (ref 96–112)
Creatinine, Ser: 1.68 mg/dL — ABNORMAL HIGH (ref 0.4–1.2)
GFR calc non Af Amer: 27 mL/min — ABNORMAL LOW (ref >60)
Glucose, Bld: 279 mg/dL — ABNORMAL HIGH (ref 70–99)
Glucose, Bld: 431 mg/dL — ABNORMAL HIGH (ref 70–99)
Potassium: 4.4 mEq/L (ref 3.5–5.1)
Potassium: 5.2 mEq/L — ABNORMAL HIGH (ref 3.5–5.1)
Total Bilirubin: 0.6 mg/dL (ref 0.3–1.2)
Total Protein: 6.1 g/dL (ref 6.0–8.3)

## 2010-10-05 LAB — GLUCOSE, CAPILLARY
Glucose-Capillary: 122 mg/dL — ABNORMAL HIGH (ref 70–99)
Glucose-Capillary: 171 mg/dL — ABNORMAL HIGH (ref 70–99)
Glucose-Capillary: 204 mg/dL — ABNORMAL HIGH (ref 70–99)
Glucose-Capillary: 102 mg/dL — ABNORMAL HIGH (ref 70–99)
Glucose-Capillary: 127 mg/dL — ABNORMAL HIGH (ref 70–99)
Glucose-Capillary: 140 mg/dL — ABNORMAL HIGH (ref 70–99)
Glucose-Capillary: 159 mg/dL — ABNORMAL HIGH (ref 70–99)
Glucose-Capillary: 196 mg/dL — ABNORMAL HIGH (ref 70–99)
Glucose-Capillary: 336 mg/dL — ABNORMAL HIGH (ref 70–99)
Glucose-Capillary: 391 mg/dL — ABNORMAL HIGH (ref 70–99)
Glucose-Capillary: 484 mg/dL — ABNORMAL HIGH (ref 70–99)
Glucose-Capillary: 50 mg/dL — ABNORMAL LOW (ref 70–99)
Glucose-Capillary: 68 mg/dL — ABNORMAL LOW (ref 70–99)
Glucose-Capillary: 89 mg/dL (ref 70–99)

## 2010-10-05 LAB — CBC
HCT: 26.5 % — ABNORMAL LOW (ref 36.0–46.0)
HCT: 28.3 % — ABNORMAL LOW (ref 36.0–46.0)
HCT: 28.4 % — ABNORMAL LOW (ref 36.0–46.0)
HCT: 31.7 % — ABNORMAL LOW (ref 36.0–46.0)
Hemoglobin: 8.7 g/dL — ABNORMAL LOW (ref 12.0–15.0)
Hemoglobin: 9.8 g/dL — ABNORMAL LOW (ref 12.0–15.0)
MCHC: 33 g/dL (ref 30.0–36.0)
MCHC: 33.8 g/dL (ref 30.0–36.0)
MCHC: 33.8 g/dL (ref 30.0–36.0)
MCV: 90.5 fL (ref 78.0–100.0)
MCV: 91.8 fL (ref 78.0–100.0)
MCV: 91.9 fL (ref 78.0–100.0)
Platelets: 240 10*3/uL (ref 150–400)
RBC: 3.1 MIL/uL — ABNORMAL LOW (ref 3.87–5.11)
RBC: 3.2 MIL/uL — ABNORMAL LOW (ref 3.87–5.11)
RBC: 3.45 MIL/uL — ABNORMAL LOW (ref 3.87–5.11)
WBC: 5 10*3/uL (ref 4.0–10.5)
WBC: 5.7 10*3/uL (ref 4.0–10.5)
WBC: 8.6 10*3/uL (ref 4.0–10.5)

## 2010-10-05 LAB — POCT CARDIAC MARKERS
CKMB, poc: 2.5 ng/mL (ref 1.0–8.0)
Myoglobin, poc: 293 ng/mL (ref 12–200)
Troponin i, poc: <0.05 ng/mL (ref 0.00–0.09)

## 2010-10-05 LAB — BASIC METABOLIC PANEL
BUN: 117 mg/dL — ABNORMAL HIGH (ref 6–23)
BUN: 26 mg/dL — ABNORMAL HIGH (ref 6–23)
CO2: 25 mEq/L (ref 19–32)
CO2: 27 mEq/L (ref 19–32)
Calcium: 8.1 mg/dL — ABNORMAL LOW (ref 8.4–10.5)
Chloride: 104 mEq/L (ref 96–112)
Chloride: 104 mEq/L (ref 96–112)
Creatinine, Ser: 1.66 mg/dL — ABNORMAL HIGH (ref 0.4–1.2)
GFR calc Af Amer: 35 mL/min — ABNORMAL LOW (ref >60)
GFR calc Af Amer: 36 mL/min — ABNORMAL LOW (ref >60)
GFR calc non Af Amer: 14 mL/min — ABNORMAL LOW (ref >60)
Glucose, Bld: 181 mg/dL — ABNORMAL HIGH (ref 70–99)
Potassium: 4.5 mEq/L (ref 3.5–5.1)
Potassium: 5 mEq/L (ref 3.5–5.1)
Potassium: 5.4 mEq/L — ABNORMAL HIGH (ref 3.5–5.1)
Sodium: 143 mEq/L (ref 135–145)
Sodium: 133 mEq/L — ABNORMAL LOW (ref 135–145)

## 2010-10-05 LAB — CULTURE, BLOOD (ROUTINE X 2): Culture: NO GROWTH

## 2010-10-05 LAB — DIFFERENTIAL
Basophils Relative: 1 % (ref 0–1)
Eosinophils Relative: 1 % (ref 0–5)
Lymphocytes Relative: 27 % (ref 12–46)
Lymphs Abs: 2.3 10*3/uL (ref 0.7–4.0)
Monocytes Relative: 4 % (ref 3–12)
Neutro Abs: 5.8 10*3/uL (ref 1.7–7.7)

## 2010-10-05 LAB — CROSSMATCH

## 2010-10-05 LAB — ABO/RH: ABO/RH(D): A POS

## 2010-10-05 LAB — CARDIAC PANEL(CRET KIN+CKTOT+MB+TROPI)
CK, MB: 2.1 ng/mL (ref 0.3–4.0)
CK, MB: 2.3 ng/mL (ref 0.3–4.0)
Relative Index: INVALID (ref 0.0–2.5)
Total CK: 83 U/L (ref 7–177)

## 2010-10-05 LAB — BRAIN NATRIURETIC PEPTIDE: Pro B Natriuretic peptide (BNP): 970 pg/mL — ABNORMAL HIGH (ref 0.0–100.0)

## 2010-10-09 LAB — CBC
HCT: 22.9 % — ABNORMAL LOW (ref 36.0–46.0)
HCT: 25.7 % — ABNORMAL LOW (ref 36.0–46.0)
HCT: 27.9 % — ABNORMAL LOW (ref 36.0–46.0)
HCT: 28.6 % — ABNORMAL LOW (ref 36.0–46.0)
HCT: 30.2 % — ABNORMAL LOW (ref 36.0–46.0)
HCT: 30.7 % — ABNORMAL LOW (ref 36.0–46.0)
HCT: 30.9 % — ABNORMAL LOW (ref 36.0–46.0)
HCT: 31.3 % — ABNORMAL LOW (ref 36.0–46.0)
HCT: 31.7 % — ABNORMAL LOW (ref 36.0–46.0)
HCT: 33.5 % — ABNORMAL LOW (ref 36.0–46.0)
Hemoglobin: 10.1 g/dL — ABNORMAL LOW (ref 12.0–15.0)
Hemoglobin: 10.2 g/dL — ABNORMAL LOW (ref 12.0–15.0)
Hemoglobin: 10.4 g/dL — ABNORMAL LOW (ref 12.0–15.0)
Hemoglobin: 10.5 g/dL — ABNORMAL LOW (ref 12.0–15.0)
Hemoglobin: 10.6 g/dL — ABNORMAL LOW (ref 12.0–15.0)
Hemoglobin: 11.1 g/dL — ABNORMAL LOW (ref 12.0–15.0)
Hemoglobin: 7.6 g/dL — ABNORMAL LOW (ref 12.0–15.0)
Hemoglobin: 9.1 g/dL — ABNORMAL LOW (ref 12.0–15.0)
MCHC: 32.5 g/dL (ref 30.0–36.0)
MCHC: 32.6 g/dL (ref 30.0–36.0)
MCHC: 32.9 g/dL (ref 30.0–36.0)
MCHC: 32.9 g/dL (ref 30.0–36.0)
MCHC: 33.2 g/dL (ref 30.0–36.0)
MCHC: 33.4 g/dL (ref 30.0–36.0)
MCHC: 33.5 g/dL (ref 30.0–36.0)
MCHC: 33.5 g/dL (ref 30.0–36.0)
MCHC: 33.7 g/dL (ref 30.0–36.0)
MCHC: 34.2 g/dL (ref 30.0–36.0)
MCV: 88.7 fL (ref 78.0–100.0)
MCV: 88.8 fL (ref 78.0–100.0)
MCV: 89.4 fL (ref 78.0–100.0)
MCV: 89.9 fL (ref 78.0–100.0)
MCV: 90.3 fL (ref 78.0–100.0)
MCV: 90.4 fL (ref 78.0–100.0)
MCV: 90.8 fL (ref 78.0–100.0)
MCV: 90.8 fL (ref 78.0–100.0)
MCV: 91.1 fL (ref 78.0–100.0)
MCV: 91.2 fL (ref 78.0–100.0)
MCV: 91.6 fL (ref 78.0–100.0)
MCV: 92.7 fL (ref 78.0–100.0)
Platelets: 175 10*3/uL (ref 150–400)
Platelets: 176 10*3/uL (ref 150–400)
Platelets: 191 10*3/uL (ref 150–400)
Platelets: 193 10*3/uL (ref 150–400)
Platelets: 199 10*3/uL (ref 150–400)
Platelets: 208 10*3/uL (ref 150–400)
Platelets: 234 10*3/uL (ref 150–400)
Platelets: 404 10*3/uL — ABNORMAL HIGH (ref 150–400)
Platelets: 430 10*3/uL — ABNORMAL HIGH (ref 150–400)
RBC: 2.89 MIL/uL — ABNORMAL LOW (ref 3.87–5.11)
RBC: 3.15 MIL/uL — ABNORMAL LOW (ref 3.87–5.11)
RBC: 3.26 MIL/uL — ABNORMAL LOW (ref 3.87–5.11)
RBC: 3.37 MIL/uL — ABNORMAL LOW (ref 3.87–5.11)
RBC: 3.44 MIL/uL — ABNORMAL LOW (ref 3.87–5.11)
RBC: 3.45 MIL/uL — ABNORMAL LOW (ref 3.87–5.11)
RBC: 3.52 MIL/uL — ABNORMAL LOW (ref 3.87–5.11)
RBC: 3.54 MIL/uL — ABNORMAL LOW (ref 3.87–5.11)
RBC: 3.57 MIL/uL — ABNORMAL LOW (ref 3.87–5.11)
RBC: 3.68 MIL/uL — ABNORMAL LOW (ref 3.87–5.11)
RBC: 3.7 MIL/uL — ABNORMAL LOW (ref 3.87–5.11)
RBC: 3.78 MIL/uL — ABNORMAL LOW (ref 3.87–5.11)
RBC: 3.88 MIL/uL (ref 3.87–5.11)
RDW: 16 % — ABNORMAL HIGH (ref 11.5–15.5)
RDW: 16.3 % — ABNORMAL HIGH (ref 11.5–15.5)
RDW: 17 % — ABNORMAL HIGH (ref 11.5–15.5)
RDW: 17.1 % — ABNORMAL HIGH (ref 11.5–15.5)
RDW: 17.3 % — ABNORMAL HIGH (ref 11.5–15.5)
RDW: 17.5 % — ABNORMAL HIGH (ref 11.5–15.5)
RDW: 18.1 % — ABNORMAL HIGH (ref 11.5–15.5)
WBC: 12.2 10*3/uL — ABNORMAL HIGH (ref 4.0–10.5)
WBC: 12.9 10*3/uL — ABNORMAL HIGH (ref 4.0–10.5)
WBC: 13.8 10*3/uL — ABNORMAL HIGH (ref 4.0–10.5)
WBC: 13.8 10*3/uL — ABNORMAL HIGH (ref 4.0–10.5)
WBC: 14.7 10*3/uL — ABNORMAL HIGH (ref 4.0–10.5)
WBC: 15.3 10*3/uL — ABNORMAL HIGH (ref 4.0–10.5)
WBC: 15.7 10*3/uL — ABNORMAL HIGH (ref 4.0–10.5)
WBC: 16 10*3/uL — ABNORMAL HIGH (ref 4.0–10.5)
WBC: 16.9 10*3/uL — ABNORMAL HIGH (ref 4.0–10.5)
WBC: 17 10*3/uL — ABNORMAL HIGH (ref 4.0–10.5)
WBC: 17.8 10*3/uL — ABNORMAL HIGH (ref 4.0–10.5)
WBC: 18.3 10*3/uL — ABNORMAL HIGH (ref 4.0–10.5)
WBC: 4.9 10*3/uL (ref 4.0–10.5)
WBC: 6.9 10*3/uL (ref 4.0–10.5)
WBC: 8.1 10*3/uL (ref 4.0–10.5)
WBC: 8.2 10*3/uL (ref 4.0–10.5)
WBC: 9.9 10*3/uL (ref 4.0–10.5)
WBC: 9.9 10*3/uL (ref 4.0–10.5)

## 2010-10-09 LAB — COMPREHENSIVE METABOLIC PANEL
ALT: 10 U/L (ref 0–35)
ALT: 12 U/L (ref 0–35)
ALT: 8 U/L (ref 0–35)
ALT: 9 U/L (ref 0–35)
ALT: 9 U/L (ref 0–35)
ALT: <8 U/L (ref 0–35)
AST: 13 U/L (ref 0–37)
AST: 16 U/L (ref 0–37)
AST: 20 U/L (ref 0–37)
AST: 21 U/L (ref 0–37)
AST: 25 U/L (ref 0–37)
Albumin: 1.3 g/dL — ABNORMAL LOW (ref 3.5–5.2)
Albumin: 1.6 g/dL — ABNORMAL LOW (ref 3.5–5.2)
Albumin: 1.7 g/dL — ABNORMAL LOW (ref 3.5–5.2)
Albumin: 2.3 g/dL — ABNORMAL LOW (ref 3.5–5.2)
Albumin: 2.6 g/dL — ABNORMAL LOW (ref 3.5–5.2)
Alkaline Phosphatase: 121 U/L — ABNORMAL HIGH (ref 39–117)
Alkaline Phosphatase: 62 U/L (ref 39–117)
Alkaline Phosphatase: 64 U/L (ref 39–117)
Alkaline Phosphatase: 79 U/L (ref 39–117)
Alkaline Phosphatase: 82 U/L (ref 39–117)
Alkaline Phosphatase: 93 U/L (ref 39–117)
BUN: 115 mg/dL — ABNORMAL HIGH (ref 6–23)
BUN: 117 mg/dL — ABNORMAL HIGH (ref 6–23)
BUN: 64 mg/dL — ABNORMAL HIGH (ref 6–23)
BUN: 65 mg/dL — ABNORMAL HIGH (ref 6–23)
BUN: 98 mg/dL — ABNORMAL HIGH (ref 6–23)
CO2: 13 mEq/L — ABNORMAL LOW (ref 19–32)
CO2: 17 mEq/L — ABNORMAL LOW (ref 19–32)
CO2: 19 mEq/L (ref 19–32)
CO2: 22 mEq/L (ref 19–32)
CO2: 24 mEq/L (ref 19–32)
CO2: 26 mEq/L (ref 19–32)
Calcium: 7.4 mg/dL — ABNORMAL LOW (ref 8.4–10.5)
Calcium: 7.5 mg/dL — ABNORMAL LOW (ref 8.4–10.5)
Calcium: 7.7 mg/dL — ABNORMAL LOW (ref 8.4–10.5)
Calcium: 7.9 mg/dL — ABNORMAL LOW (ref 8.4–10.5)
Calcium: 8.5 mg/dL (ref 8.4–10.5)
Chloride: 100 mEq/L (ref 96–112)
Chloride: 105 mEq/L (ref 96–112)
Chloride: 109 mEq/L (ref 96–112)
Chloride: 109 mEq/L (ref 96–112)
Chloride: 113 mEq/L — ABNORMAL HIGH (ref 96–112)
Chloride: 99 mEq/L (ref 96–112)
Creatinine, Ser: 3.23 mg/dL — ABNORMAL HIGH (ref 0.4–1.2)
Creatinine, Ser: 4.92 mg/dL — ABNORMAL HIGH (ref 0.4–1.2)
GFR calc Af Amer: 10 mL/min — ABNORMAL LOW (ref >60)
GFR calc Af Amer: 11 mL/min — ABNORMAL LOW (ref >60)
GFR calc Af Amer: 12 mL/min — ABNORMAL LOW (ref >60)
GFR calc Af Amer: 16 mL/min — ABNORMAL LOW (ref >60)
GFR calc Af Amer: 32 mL/min — ABNORMAL LOW (ref >60)
GFR calc non Af Amer: 10 mL/min — ABNORMAL LOW (ref >60)
GFR calc non Af Amer: 10 mL/min — ABNORMAL LOW (ref >60)
GFR calc non Af Amer: 26 mL/min — ABNORMAL LOW (ref >60)
GFR calc non Af Amer: 9 mL/min — ABNORMAL LOW (ref >60)
GFR calc non Af Amer: 9 mL/min — ABNORMAL LOW (ref >60)
Glucose, Bld: 113 mg/dL — ABNORMAL HIGH (ref 70–99)
Glucose, Bld: 126 mg/dL — ABNORMAL HIGH (ref 70–99)
Glucose, Bld: 195 mg/dL — ABNORMAL HIGH (ref 70–99)
Glucose, Bld: 240 mg/dL — ABNORMAL HIGH (ref 70–99)
Glucose, Bld: 52 mg/dL — ABNORMAL LOW (ref 70–99)
Glucose, Bld: 97 mg/dL (ref 70–99)
Potassium: 3.4 mEq/L — ABNORMAL LOW (ref 3.5–5.1)
Potassium: 3.5 mEq/L (ref 3.5–5.1)
Potassium: 3.6 mEq/L (ref 3.5–5.1)
Potassium: 3.8 mEq/L (ref 3.5–5.1)
Potassium: 4 mEq/L (ref 3.5–5.1)
Sodium: 130 mEq/L — ABNORMAL LOW (ref 135–145)
Sodium: 132 mEq/L — ABNORMAL LOW (ref 135–145)
Sodium: 133 mEq/L — ABNORMAL LOW (ref 135–145)
Sodium: 134 mEq/L — ABNORMAL LOW (ref 135–145)
Sodium: 136 mEq/L (ref 135–145)
Sodium: 139 mEq/L (ref 135–145)
Sodium: 144 mEq/L (ref 135–145)
Total Bilirubin: 0.4 mg/dL (ref 0.3–1.2)
Total Bilirubin: 0.5 mg/dL (ref 0.3–1.2)
Total Bilirubin: 0.6 mg/dL (ref 0.3–1.2)
Total Bilirubin: 0.7 mg/dL (ref 0.3–1.2)
Total Bilirubin: 0.8 mg/dL (ref 0.3–1.2)
Total Protein: 4.1 g/dL — ABNORMAL LOW (ref 6.0–8.3)
Total Protein: 4.2 g/dL — ABNORMAL LOW (ref 6.0–8.3)
Total Protein: 4.4 g/dL — ABNORMAL LOW (ref 6.0–8.3)
Total Protein: 4.4 g/dL — ABNORMAL LOW (ref 6.0–8.3)
Total Protein: 4.5 g/dL — ABNORMAL LOW (ref 6.0–8.3)
Total Protein: 4.9 g/dL — ABNORMAL LOW (ref 6.0–8.3)

## 2010-10-09 LAB — CARDIAC PANEL(CRET KIN+CKTOT+MB+TROPI)
CK, MB: 2.5 ng/mL (ref 0.3–4.0)
CK, MB: 3 ng/mL (ref 0.3–4.0)
CK, MB: 3.5 ng/mL (ref 0.3–4.0)
CK, MB: 4.3 ng/mL — ABNORMAL HIGH (ref 0.3–4.0)
Relative Index: INVALID (ref 0.0–2.5)
Relative Index: INVALID (ref 0.0–2.5)
Total CK: 24 U/L (ref 7–177)
Total CK: 42 U/L (ref 7–177)
Total CK: 43 U/L (ref 7–177)
Troponin I: 0.1 ng/mL — ABNORMAL HIGH (ref 0.00–0.06)
Troponin I: 0.17 ng/mL — ABNORMAL HIGH (ref 0.00–0.06)

## 2010-10-09 LAB — DIGOXIN LEVEL: Digoxin Level: 2.1 ng/mL — ABNORMAL HIGH (ref 0.8–2.0)

## 2010-10-09 LAB — GLUCOSE, CAPILLARY
Glucose-Capillary: 102 mg/dL — ABNORMAL HIGH (ref 70–99)
Glucose-Capillary: 105 mg/dL — ABNORMAL HIGH (ref 70–99)
Glucose-Capillary: 106 mg/dL — ABNORMAL HIGH (ref 70–99)
Glucose-Capillary: 113 mg/dL — ABNORMAL HIGH (ref 70–99)
Glucose-Capillary: 116 mg/dL — ABNORMAL HIGH (ref 70–99)
Glucose-Capillary: 120 mg/dL — ABNORMAL HIGH (ref 70–99)
Glucose-Capillary: 125 mg/dL — ABNORMAL HIGH (ref 70–99)
Glucose-Capillary: 126 mg/dL — ABNORMAL HIGH (ref 70–99)
Glucose-Capillary: 128 mg/dL — ABNORMAL HIGH (ref 70–99)
Glucose-Capillary: 129 mg/dL — ABNORMAL HIGH (ref 70–99)
Glucose-Capillary: 137 mg/dL — ABNORMAL HIGH (ref 70–99)
Glucose-Capillary: 144 mg/dL — ABNORMAL HIGH (ref 70–99)
Glucose-Capillary: 144 mg/dL — ABNORMAL HIGH (ref 70–99)
Glucose-Capillary: 146 mg/dL — ABNORMAL HIGH (ref 70–99)
Glucose-Capillary: 147 mg/dL — ABNORMAL HIGH (ref 70–99)
Glucose-Capillary: 148 mg/dL — ABNORMAL HIGH (ref 70–99)
Glucose-Capillary: 152 mg/dL — ABNORMAL HIGH (ref 70–99)
Glucose-Capillary: 154 mg/dL — ABNORMAL HIGH (ref 70–99)
Glucose-Capillary: 162 mg/dL — ABNORMAL HIGH (ref 70–99)
Glucose-Capillary: 169 mg/dL — ABNORMAL HIGH (ref 70–99)
Glucose-Capillary: 172 mg/dL — ABNORMAL HIGH (ref 70–99)
Glucose-Capillary: 173 mg/dL — ABNORMAL HIGH (ref 70–99)
Glucose-Capillary: 176 mg/dL — ABNORMAL HIGH (ref 70–99)
Glucose-Capillary: 178 mg/dL — ABNORMAL HIGH (ref 70–99)
Glucose-Capillary: 179 mg/dL — ABNORMAL HIGH (ref 70–99)
Glucose-Capillary: 183 mg/dL — ABNORMAL HIGH (ref 70–99)
Glucose-Capillary: 184 mg/dL — ABNORMAL HIGH (ref 70–99)
Glucose-Capillary: 187 mg/dL — ABNORMAL HIGH (ref 70–99)
Glucose-Capillary: 198 mg/dL — ABNORMAL HIGH (ref 70–99)
Glucose-Capillary: 215 mg/dL — ABNORMAL HIGH (ref 70–99)
Glucose-Capillary: 216 mg/dL — ABNORMAL HIGH (ref 70–99)
Glucose-Capillary: 218 mg/dL — ABNORMAL HIGH (ref 70–99)
Glucose-Capillary: 227 mg/dL — ABNORMAL HIGH (ref 70–99)
Glucose-Capillary: 229 mg/dL — ABNORMAL HIGH (ref 70–99)
Glucose-Capillary: 230 mg/dL — ABNORMAL HIGH (ref 70–99)
Glucose-Capillary: 231 mg/dL — ABNORMAL HIGH (ref 70–99)
Glucose-Capillary: 233 mg/dL — ABNORMAL HIGH (ref 70–99)
Glucose-Capillary: 234 mg/dL — ABNORMAL HIGH (ref 70–99)
Glucose-Capillary: 271 mg/dL — ABNORMAL HIGH (ref 70–99)
Glucose-Capillary: 353 mg/dL — ABNORMAL HIGH (ref 70–99)
Glucose-Capillary: 354 mg/dL — ABNORMAL HIGH (ref 70–99)
Glucose-Capillary: 368 mg/dL — ABNORMAL HIGH (ref 70–99)
Glucose-Capillary: 37 mg/dL — CL (ref 70–99)
Glucose-Capillary: 382 mg/dL — ABNORMAL HIGH (ref 70–99)
Glucose-Capillary: 49 mg/dL — ABNORMAL LOW (ref 70–99)
Glucose-Capillary: 49 mg/dL — ABNORMAL LOW (ref 70–99)
Glucose-Capillary: 62 mg/dL — ABNORMAL LOW (ref 70–99)
Glucose-Capillary: 68 mg/dL — ABNORMAL LOW (ref 70–99)
Glucose-Capillary: 71 mg/dL (ref 70–99)
Glucose-Capillary: 73 mg/dL (ref 70–99)
Glucose-Capillary: 75 mg/dL (ref 70–99)
Glucose-Capillary: 75 mg/dL (ref 70–99)
Glucose-Capillary: 80 mg/dL (ref 70–99)
Glucose-Capillary: 81 mg/dL (ref 70–99)
Glucose-Capillary: 82 mg/dL (ref 70–99)
Glucose-Capillary: 83 mg/dL (ref 70–99)
Glucose-Capillary: 83 mg/dL (ref 70–99)
Glucose-Capillary: 89 mg/dL (ref 70–99)
Glucose-Capillary: 91 mg/dL (ref 70–99)
Glucose-Capillary: 91 mg/dL (ref 70–99)
Glucose-Capillary: 92 mg/dL (ref 70–99)
Glucose-Capillary: 97 mg/dL (ref 70–99)
Glucose-Capillary: 99 mg/dL (ref 70–99)

## 2010-10-09 LAB — MAGNESIUM
Magnesium: 1.7 mg/dL (ref 1.5–2.5)
Magnesium: 1.7 mg/dL (ref 1.5–2.5)
Magnesium: 1.8 mg/dL (ref 1.5–2.5)
Magnesium: 1.9 mg/dL (ref 1.5–2.5)
Magnesium: 2.2 mg/dL (ref 1.5–2.5)
Magnesium: 2.2 mg/dL (ref 1.5–2.5)
Magnesium: 2.3 mg/dL (ref 1.5–2.5)
Magnesium: 2.5 mg/dL (ref 1.5–2.5)

## 2010-10-09 LAB — TRIGLYCERIDES
Triglycerides: 37 mg/dL (ref <150)
Triglycerides: 98 mg/dL (ref <150)

## 2010-10-09 LAB — DIFFERENTIAL
Basophils Absolute: 0 10*3/uL (ref 0.0–0.1)
Basophils Absolute: 0 10*3/uL (ref 0.0–0.1)
Basophils Relative: 0 % (ref 0–1)
Basophils Relative: 0 % (ref 0–1)
Eosinophils Absolute: 0 10*3/uL (ref 0.0–0.7)
Eosinophils Absolute: 0.1 10*3/uL (ref 0.0–0.7)
Eosinophils Relative: 0 % (ref 0–5)
Eosinophils Relative: 0 % (ref 0–5)
Eosinophils Relative: 1 % (ref 0–5)
Eosinophils Relative: 1 % (ref 0–5)
Lymphocytes Relative: 16 % (ref 12–46)
Lymphocytes Relative: 7 % — ABNORMAL LOW (ref 12–46)
Lymphs Abs: 1 10*3/uL (ref 0.7–4.0)
Lymphs Abs: 1.1 10*3/uL (ref 0.7–4.0)
Lymphs Abs: 1.1 10*3/uL (ref 0.7–4.0)
Lymphs Abs: 1.1 10*3/uL (ref 0.7–4.0)
Lymphs Abs: 1.5 10*3/uL (ref 0.7–4.0)
Lymphs Abs: 1.6 10*3/uL (ref 0.7–4.0)
Monocytes Absolute: 0.3 10*3/uL (ref 0.1–1.0)
Monocytes Absolute: 0.3 10*3/uL (ref 0.1–1.0)
Monocytes Absolute: 0.4 10*3/uL (ref 0.1–1.0)
Monocytes Absolute: 0.4 10*3/uL (ref 0.1–1.0)
Monocytes Absolute: 0.5 10*3/uL (ref 0.1–1.0)
Monocytes Absolute: 0.5 10*3/uL (ref 0.1–1.0)
Monocytes Absolute: 0.7 10*3/uL (ref 0.1–1.0)
Monocytes Relative: 2 % — ABNORMAL LOW (ref 3–12)
Monocytes Relative: 2 % — ABNORMAL LOW (ref 3–12)
Monocytes Relative: 2 % — ABNORMAL LOW (ref 3–12)
Monocytes Relative: 3 % (ref 3–12)
Neutro Abs: 12.1 10*3/uL — ABNORMAL HIGH (ref 1.7–7.7)
Neutro Abs: 15.6 10*3/uL — ABNORMAL HIGH (ref 1.7–7.7)
Neutro Abs: 17 10*3/uL — ABNORMAL HIGH (ref 1.7–7.7)
Neutrophils Relative %: 87 % — ABNORMAL HIGH (ref 43–77)
Neutrophils Relative %: 88 % — ABNORMAL HIGH (ref 43–77)
WBC Morphology: INCREASED

## 2010-10-09 LAB — URINALYSIS, ROUTINE W REFLEX MICROSCOPIC
Bilirubin Urine: NEGATIVE
Glucose, UA: NEGATIVE mg/dL
Ketones, ur: NEGATIVE mg/dL
Nitrite: NEGATIVE
Specific Gravity, Urine: 1.02 (ref 1.005–1.030)
pH: 5.5 (ref 5.0–8.0)

## 2010-10-09 LAB — BASIC METABOLIC PANEL
BUN: 50 mg/dL — ABNORMAL HIGH (ref 6–23)
BUN: 54 mg/dL — ABNORMAL HIGH (ref 6–23)
BUN: 91 mg/dL — ABNORMAL HIGH (ref 6–23)
CO2: 20 mEq/L (ref 19–32)
CO2: 20 mEq/L (ref 19–32)
CO2: 22 mEq/L (ref 19–32)
CO2: 30 mEq/L (ref 19–32)
CO2: 31 mEq/L (ref 19–32)
Calcium: 6.6 mg/dL — ABNORMAL LOW (ref 8.4–10.5)
Calcium: 7.5 mg/dL — ABNORMAL LOW (ref 8.4–10.5)
Calcium: 7.6 mg/dL — ABNORMAL LOW (ref 8.4–10.5)
Calcium: 7.6 mg/dL — ABNORMAL LOW (ref 8.4–10.5)
Calcium: 7.7 mg/dL — ABNORMAL LOW (ref 8.4–10.5)
Calcium: 7.7 mg/dL — ABNORMAL LOW (ref 8.4–10.5)
Calcium: 7.9 mg/dL — ABNORMAL LOW (ref 8.4–10.5)
Calcium: 8.1 mg/dL — ABNORMAL LOW (ref 8.4–10.5)
Chloride: 101 mEq/L (ref 96–112)
Chloride: 109 mEq/L (ref 96–112)
Chloride: 113 mEq/L — ABNORMAL HIGH (ref 96–112)
Chloride: 98 mEq/L (ref 96–112)
Chloride: 98 mEq/L (ref 96–112)
Chloride: 99 mEq/L (ref 96–112)
Creatinine, Ser: 2.43 mg/dL — ABNORMAL HIGH (ref 0.4–1.2)
Creatinine, Ser: 3.25 mg/dL — ABNORMAL HIGH (ref 0.4–1.2)
Creatinine, Ser: 3.42 mg/dL — ABNORMAL HIGH (ref 0.4–1.2)
Creatinine, Ser: 3.68 mg/dL — ABNORMAL HIGH (ref 0.4–1.2)
Creatinine, Ser: 3.92 mg/dL — ABNORMAL HIGH (ref 0.4–1.2)
Creatinine, Ser: 4.5 mg/dL — ABNORMAL HIGH (ref 0.4–1.2)
Creatinine, Ser: 4.59 mg/dL — ABNORMAL HIGH (ref 0.4–1.2)
GFR calc Af Amer: 11 mL/min — ABNORMAL LOW (ref >60)
GFR calc Af Amer: 11 mL/min — ABNORMAL LOW (ref >60)
GFR calc Af Amer: 12 mL/min — ABNORMAL LOW (ref >60)
GFR calc Af Amer: 12 mL/min — ABNORMAL LOW (ref >60)
GFR calc Af Amer: 13 mL/min — ABNORMAL LOW (ref >60)
GFR calc Af Amer: 13 mL/min — ABNORMAL LOW (ref >60)
GFR calc Af Amer: 14 mL/min — ABNORMAL LOW (ref >60)
GFR calc Af Amer: 16 mL/min — ABNORMAL LOW (ref >60)
GFR calc Af Amer: 23 mL/min — ABNORMAL LOW (ref >60)
GFR calc Af Amer: 32 mL/min — ABNORMAL LOW (ref >60)
GFR calc non Af Amer: 10 mL/min — ABNORMAL LOW (ref >60)
GFR calc non Af Amer: 10 mL/min — ABNORMAL LOW (ref >60)
GFR calc non Af Amer: 11 mL/min — ABNORMAL LOW (ref >60)
GFR calc non Af Amer: 12 mL/min — ABNORMAL LOW (ref >60)
GFR calc non Af Amer: 14 mL/min — ABNORMAL LOW (ref >60)
GFR calc non Af Amer: 9 mL/min — ABNORMAL LOW (ref >60)
GFR calc non Af Amer: 9 mL/min — ABNORMAL LOW (ref >60)
Glucose, Bld: 177 mg/dL — ABNORMAL HIGH (ref 70–99)
Glucose, Bld: 256 mg/dL — ABNORMAL HIGH (ref 70–99)
Glucose, Bld: 371 mg/dL — ABNORMAL HIGH (ref 70–99)
Potassium: 3.2 mEq/L — ABNORMAL LOW (ref 3.5–5.1)
Potassium: 3.8 mEq/L (ref 3.5–5.1)
Potassium: 3.9 mEq/L (ref 3.5–5.1)
Potassium: 4 mEq/L (ref 3.5–5.1)
Potassium: 4 mEq/L (ref 3.5–5.1)
Potassium: 4.5 mEq/L (ref 3.5–5.1)
Sodium: 125 mEq/L — ABNORMAL LOW (ref 135–145)
Sodium: 129 mEq/L — ABNORMAL LOW (ref 135–145)
Sodium: 131 mEq/L — ABNORMAL LOW (ref 135–145)
Sodium: 132 mEq/L — ABNORMAL LOW (ref 135–145)
Sodium: 138 mEq/L (ref 135–145)

## 2010-10-09 LAB — PREALBUMIN: Prealbumin: 6.8 mg/dL — ABNORMAL LOW (ref 18.0–45.0)

## 2010-10-09 LAB — CROSSMATCH
ABO/RH(D): A POS
ABO/RH(D): A POS
Antibody Screen: NEGATIVE

## 2010-10-09 LAB — RENAL FUNCTION PANEL
CO2: 12 mEq/L — ABNORMAL LOW (ref 19–32)
CO2: 17 mEq/L — ABNORMAL LOW (ref 19–32)
Chloride: 105 mEq/L (ref 96–112)
Chloride: 107 mEq/L (ref 96–112)
Creatinine, Ser: 4.54 mg/dL — ABNORMAL HIGH (ref 0.4–1.2)
GFR calc Af Amer: 11 mL/min — ABNORMAL LOW (ref >60)
GFR calc non Af Amer: 9 mL/min — ABNORMAL LOW (ref >60)
Glucose, Bld: 196 mg/dL — ABNORMAL HIGH (ref 70–99)
Potassium: 4.1 mEq/L (ref 3.5–5.1)
Sodium: 134 mEq/L — ABNORMAL LOW (ref 135–145)

## 2010-10-09 LAB — SEDIMENTATION RATE: Sed Rate: 6 mm/hr (ref 0–22)

## 2010-10-09 LAB — STOOL CULTURE

## 2010-10-09 LAB — PROTIME-INR
INR: 1.32 (ref 0.00–1.49)
INR: 1.4 (ref 0.00–1.49)

## 2010-10-09 LAB — URINE MICROSCOPIC-ADD ON

## 2010-10-09 LAB — PHOSPHORUS
Phosphorus: 3.1 mg/dL (ref 2.3–4.6)
Phosphorus: 3.6 mg/dL (ref 2.3–4.6)
Phosphorus: 5.7 mg/dL — ABNORMAL HIGH (ref 2.3–4.6)

## 2010-10-09 LAB — TSH: TSH: 7.125 u[IU]/mL — ABNORMAL HIGH (ref 0.350–4.500)

## 2010-10-09 LAB — ALT: ALT: 10 U/L (ref 0–35)

## 2010-10-09 LAB — CLOSTRIDIUM DIFFICILE EIA

## 2010-10-09 LAB — CHOLESTEROL, TOTAL
Cholesterol: 50 mg/dL (ref 0–200)
Cholesterol: 65 mg/dL (ref 0–200)

## 2010-10-09 LAB — BRAIN NATRIURETIC PEPTIDE
Pro B Natriuretic peptide (BNP): 1352 pg/mL — ABNORMAL HIGH (ref 0.0–100.0)
Pro B Natriuretic peptide (BNP): 2025 pg/mL — ABNORMAL HIGH (ref 0.0–100.0)

## 2010-10-09 LAB — HEPATITIS B CORE ANTIBODY, TOTAL: Hep B Core Total Ab: NEGATIVE

## 2010-10-09 LAB — POCT CARDIAC MARKERS: Troponin i, poc: <0.05 ng/mL (ref 0.00–0.09)

## 2010-10-09 LAB — HEPATITIS B SURFACE ANTIGEN: Hepatitis B Surface Ag: NEGATIVE

## 2010-10-18 LAB — CARDIAC PANEL(CRET KIN+CKTOT+MB+TROPI)
CK, MB: 3.6 ng/mL (ref 0.3–4.0)
CK, MB: 4 ng/mL (ref 0.3–4.0)
Relative Index: 3 — ABNORMAL HIGH (ref 0.0–2.5)
Relative Index: 3.7 — ABNORMAL HIGH (ref 0.0–2.5)
Total CK: 105 U/L (ref 7–177)
Total CK: 116 U/L (ref 7–177)
Total CK: 132 U/L (ref 7–177)
Troponin I: 0.02 ng/mL (ref 0.00–0.06)
Troponin I: 0.02 ng/mL (ref 0.00–0.06)

## 2010-10-18 LAB — URINE MICROSCOPIC-ADD ON

## 2010-10-18 LAB — GLUCOSE, CAPILLARY
Glucose-Capillary: 116 mg/dL — ABNORMAL HIGH (ref 70–99)
Glucose-Capillary: 125 mg/dL — ABNORMAL HIGH (ref 70–99)
Glucose-Capillary: 183 mg/dL — ABNORMAL HIGH (ref 70–99)
Glucose-Capillary: 189 mg/dL — ABNORMAL HIGH (ref 70–99)
Glucose-Capillary: 227 mg/dL — ABNORMAL HIGH (ref 70–99)
Glucose-Capillary: 339 mg/dL — ABNORMAL HIGH (ref 70–99)
Glucose-Capillary: 82 mg/dL (ref 70–99)
Glucose-Capillary: 89 mg/dL (ref 70–99)

## 2010-10-18 LAB — BASIC METABOLIC PANEL
BUN: 22 mg/dL (ref 6–23)
Calcium: 8.5 mg/dL (ref 8.4–10.5)
Chloride: 100 mEq/L (ref 96–112)
GFR calc Af Amer: 42 mL/min — ABNORMAL LOW (ref >60)
GFR calc non Af Amer: 31 mL/min — ABNORMAL LOW (ref >60)
GFR calc non Af Amer: 35 mL/min — ABNORMAL LOW (ref >60)
GFR calc non Af Amer: 38 mL/min — ABNORMAL LOW (ref >60)
Glucose, Bld: 130 mg/dL — ABNORMAL HIGH (ref 70–99)
Glucose, Bld: 136 mg/dL — ABNORMAL HIGH (ref 70–99)
Glucose, Bld: 212 mg/dL — ABNORMAL HIGH (ref 70–99)
Potassium: 4.3 mEq/L (ref 3.5–5.1)
Potassium: 4.5 mEq/L (ref 3.5–5.1)
Potassium: 4.7 mEq/L (ref 3.5–5.1)
Sodium: 132 mEq/L — ABNORMAL LOW (ref 135–145)
Sodium: 133 mEq/L — ABNORMAL LOW (ref 135–145)

## 2010-10-18 LAB — CBC
HCT: 27.8 % — ABNORMAL LOW (ref 36.0–46.0)
Hemoglobin: 9.4 g/dL — ABNORMAL LOW (ref 12.0–15.0)
Hemoglobin: 9.5 g/dL — ABNORMAL LOW (ref 12.0–15.0)
MCHC: 33.5 g/dL (ref 30.0–36.0)
MCV: 92.7 fL (ref 78.0–100.0)
Platelets: 230 10*3/uL (ref 150–400)
Platelets: 237 10*3/uL (ref 150–400)
RDW: 14.8 % (ref 11.5–15.5)
RDW: 15.5 % (ref 11.5–15.5)
WBC: 4.4 10*3/uL (ref 4.0–10.5)
WBC: 9.1 10*3/uL (ref 4.0–10.5)

## 2010-10-18 LAB — URINALYSIS, ROUTINE W REFLEX MICROSCOPIC
Bilirubin Urine: NEGATIVE
Glucose, UA: NEGATIVE mg/dL
Ketones, ur: NEGATIVE mg/dL
Nitrite: NEGATIVE
Specific Gravity, Urine: 1.013 (ref 1.005–1.030)
pH: 6.5 (ref 5.0–8.0)

## 2010-10-18 LAB — POCT CARDIAC MARKERS
CKMB, poc: 1.8 ng/mL (ref 1.0–8.0)
Myoglobin, poc: 150 ng/mL (ref 12–200)
Myoglobin, poc: 168 ng/mL (ref 12–200)
Troponin i, poc: <0.05 ng/mL (ref 0.00–0.09)

## 2010-10-18 LAB — LIPID PANEL
Cholesterol: 187 mg/dL (ref 0–200)
LDL Cholesterol: 91 mg/dL (ref 0–99)
Triglycerides: 42 mg/dL (ref <150)

## 2010-10-18 LAB — DIFFERENTIAL
Lymphocytes Relative: 21 % (ref 12–46)
Lymphs Abs: 1.9 10*3/uL (ref 0.7–4.0)
Monocytes Absolute: 0.5 10*3/uL (ref 0.1–1.0)
Monocytes Relative: 6 % (ref 3–12)
Neutro Abs: 6.3 10*3/uL (ref 1.7–7.7)
Neutrophils Relative %: 70 % (ref 43–77)

## 2010-10-18 LAB — CK TOTAL AND CKMB (NOT AT ARMC)
Relative Index: INVALID (ref 0.0–2.5)
Total CK: 96 U/L (ref 7–177)

## 2010-10-22 LAB — CULTURE, BLOOD (ROUTINE X 2): Culture: NO GROWTH

## 2010-10-22 LAB — CARDIAC PANEL(CRET KIN+CKTOT+MB+TROPI)
CK, MB: 3.5 ng/mL (ref 0.3–4.0)
Relative Index: INVALID (ref 0.0–2.5)
Relative Index: INVALID (ref 0.0–2.5)
Relative Index: INVALID (ref 0.0–2.5)
Total CK: 68 U/L (ref 7–177)
Total CK: 79 U/L (ref 7–177)
Total CK: 90 U/L (ref 7–177)
Troponin I: 0.03 ng/mL (ref 0.00–0.06)
Troponin I: 0.03 ng/mL (ref 0.00–0.06)
Troponin I: 0.07 ng/mL — ABNORMAL HIGH (ref 0.00–0.06)

## 2010-10-22 LAB — LACTIC ACID, PLASMA
Lactic Acid, Venous: 1.4 mmol/L (ref 0.5–2.2)
Lactic Acid, Venous: 8.6 mmol/L — ABNORMAL HIGH (ref 0.5–2.2)

## 2010-10-22 LAB — DIFFERENTIAL
Eosinophils Absolute: 0 10*3/uL (ref 0.0–0.7)
Lymphocytes Relative: 15 % (ref 12–46)
Lymphs Abs: 1.8 10*3/uL (ref 0.7–4.0)
Monocytes Relative: 3 % (ref 3–12)
Neutrophils Relative %: 82 % — ABNORMAL HIGH (ref 43–77)

## 2010-10-22 LAB — COMPREHENSIVE METABOLIC PANEL
ALT: 269 U/L — ABNORMAL HIGH (ref 0–35)
BUN: 42 mg/dL — ABNORMAL HIGH (ref 6–23)
CO2: 26 mEq/L (ref 19–32)
CO2: 27 mEq/L (ref 19–32)
Calcium: 8 mg/dL — ABNORMAL LOW (ref 8.4–10.5)
Calcium: 8.6 mg/dL (ref 8.4–10.5)
Calcium: 8.9 mg/dL (ref 8.4–10.5)
Creatinine, Ser: 1.6 mg/dL — ABNORMAL HIGH (ref 0.4–1.2)
Creatinine, Ser: 2.46 mg/dL — ABNORMAL HIGH (ref 0.4–1.2)
GFR calc Af Amer: 21 mL/min — ABNORMAL LOW (ref >60)
GFR calc Af Amer: 38 mL/min — ABNORMAL LOW (ref >60)
GFR calc non Af Amer: 19 mL/min — ABNORMAL LOW (ref >60)
GFR calc non Af Amer: 31 mL/min — ABNORMAL LOW (ref >60)
Glucose, Bld: 193 mg/dL — ABNORMAL HIGH (ref 70–99)
Glucose, Bld: 302 mg/dL — ABNORMAL HIGH (ref 70–99)
Glucose, Bld: 371 mg/dL — ABNORMAL HIGH (ref 70–99)
Sodium: 123 mEq/L — ABNORMAL LOW (ref 135–145)
Sodium: 135 mEq/L (ref 135–145)
Total Protein: 5.4 g/dL — ABNORMAL LOW (ref 6.0–8.3)
Total Protein: 5.6 g/dL — ABNORMAL LOW (ref 6.0–8.3)
Total Protein: 5.7 g/dL — ABNORMAL LOW (ref 6.0–8.3)

## 2010-10-22 LAB — URINALYSIS, ROUTINE W REFLEX MICROSCOPIC
Nitrite: NEGATIVE
Specific Gravity, Urine: 1.017 (ref 1.005–1.030)
Urobilinogen, UA: 1 mg/dL (ref 0.0–1.0)

## 2010-10-22 LAB — GLUCOSE, CAPILLARY
Glucose-Capillary: 122 mg/dL — ABNORMAL HIGH (ref 70–99)
Glucose-Capillary: 163 mg/dL — ABNORMAL HIGH (ref 70–99)
Glucose-Capillary: 246 mg/dL — ABNORMAL HIGH (ref 70–99)
Glucose-Capillary: 291 mg/dL — ABNORMAL HIGH (ref 70–99)
Glucose-Capillary: 303 mg/dL — ABNORMAL HIGH (ref 70–99)
Glucose-Capillary: 315 mg/dL — ABNORMAL HIGH (ref 70–99)
Glucose-Capillary: 48 mg/dL — ABNORMAL LOW (ref 70–99)
Glucose-Capillary: 54 mg/dL — ABNORMAL LOW (ref 70–99)
Glucose-Capillary: 71 mg/dL (ref 70–99)
Glucose-Capillary: 89 mg/dL (ref 70–99)

## 2010-10-22 LAB — CK TOTAL AND CKMB (NOT AT ARMC)
Relative Index: INVALID (ref 0.0–2.5)
Total CK: 62 U/L (ref 7–177)

## 2010-10-22 LAB — POCT CARDIAC MARKERS
CKMB, poc: 1.4 ng/mL (ref 1.0–8.0)
Myoglobin, poc: 129 ng/mL (ref 12–200)
Troponin i, poc: <0.05 ng/mL (ref 0.00–0.09)

## 2010-10-22 LAB — CBC
HCT: 23.2 % — ABNORMAL LOW (ref 36.0–46.0)
Hemoglobin: 8 g/dL — ABNORMAL LOW (ref 12.0–15.0)
Hemoglobin: 9.1 g/dL — ABNORMAL LOW (ref 12.0–15.0)
MCHC: 32.7 g/dL (ref 30.0–36.0)
MCHC: 33.3 g/dL (ref 30.0–36.0)
MCHC: 34.6 g/dL (ref 30.0–36.0)
MCV: 92 fL (ref 78.0–100.0)
MCV: 92.4 fL (ref 78.0–100.0)
RBC: 2.52 MIL/uL — ABNORMAL LOW (ref 3.87–5.11)
RBC: 2.8 MIL/uL — ABNORMAL LOW (ref 3.87–5.11)
RBC: 3 MIL/uL — ABNORMAL LOW (ref 3.87–5.11)
RDW: 14.9 % (ref 11.5–15.5)
RDW: 15.2 % (ref 11.5–15.5)
WBC: 11.7 10*3/uL — ABNORMAL HIGH (ref 4.0–10.5)

## 2010-10-22 LAB — BASIC METABOLIC PANEL
CO2: 18 mEq/L — ABNORMAL LOW (ref 19–32)
Calcium: 9.1 mg/dL (ref 8.4–10.5)
Calcium: 9.9 mg/dL (ref 8.4–10.5)
Creatinine, Ser: 2.86 mg/dL — ABNORMAL HIGH (ref 0.4–1.2)
Creatinine, Ser: 2.92 mg/dL — ABNORMAL HIGH (ref 0.4–1.2)
GFR calc non Af Amer: 15 mL/min — ABNORMAL LOW (ref >60)
Glucose, Bld: 264 mg/dL — ABNORMAL HIGH (ref 70–99)
Glucose, Bld: 301 mg/dL — ABNORMAL HIGH (ref 70–99)
Sodium: 129 mEq/L — ABNORMAL LOW (ref 135–145)

## 2010-10-22 LAB — PHOSPHORUS: Phosphorus: 2.8 mg/dL (ref 2.3–4.6)

## 2010-10-22 LAB — ABO/RH: ABO/RH(D): A POS

## 2010-10-22 LAB — URINE MICROSCOPIC-ADD ON

## 2010-10-22 LAB — URINE CULTURE: Culture: NO GROWTH

## 2010-10-22 LAB — PROTIME-INR
INR: 1.3 (ref 0.00–1.49)
Prothrombin Time: 15.8 seconds — ABNORMAL HIGH (ref 11.6–15.2)

## 2010-10-22 LAB — BRAIN NATRIURETIC PEPTIDE: Pro B Natriuretic peptide (BNP): 208 pg/mL — ABNORMAL HIGH (ref 0.0–100.0)

## 2010-10-22 LAB — POCT I-STAT 3, ART BLOOD GAS (G3+)
O2 Saturation: 99 %
Patient temperature: 37
TCO2: 15 mmol/L (ref 0–100)

## 2010-10-22 LAB — MAGNESIUM: Magnesium: 1.9 mg/dL (ref 1.5–2.5)

## 2010-10-22 LAB — POCT I-STAT, CHEM 8
Chloride: 105 mEq/L (ref 96–112)
Creatinine, Ser: 2.7 mg/dL — ABNORMAL HIGH (ref 0.4–1.2)
Glucose, Bld: 303 mg/dL — ABNORMAL HIGH (ref 70–99)
Potassium: 7.2 mEq/L (ref 3.5–5.1)

## 2010-10-22 LAB — APTT: aPTT: 28 seconds (ref 24–37)

## 2010-10-25 LAB — CBC
HCT: 37.1 % (ref 36.0–46.0)
Hemoglobin: 12.4 g/dL (ref 12.0–15.0)
MCHC: 33.6 g/dL (ref 30.0–36.0)
MCV: 93.2 fL (ref 78.0–100.0)
Platelets: 126 10*3/uL — ABNORMAL LOW (ref 150–400)
RBC: 3.98 MIL/uL (ref 3.87–5.11)
RDW: 16.3 % — ABNORMAL HIGH (ref 11.5–15.5)
WBC: 3.4 10*3/uL — ABNORMAL LOW (ref 4.0–10.5)

## 2010-10-25 LAB — POCT I-STAT, CHEM 8
BUN: 28 mg/dL — ABNORMAL HIGH (ref 6–23)
Calcium, Ion: 1.12 mmol/L (ref 1.12–1.32)
Chloride: 111 meq/L (ref 96–112)
Creatinine, Ser: 1.1 mg/dL (ref 0.4–1.2)
Glucose, Bld: 216 mg/dL — ABNORMAL HIGH (ref 70–99)
HCT: 25 % — ABNORMAL LOW (ref 36.0–46.0)
Hemoglobin: 8.5 g/dL — ABNORMAL LOW (ref 12.0–15.0)
Potassium: 4.5 meq/L (ref 3.5–5.1)
Sodium: 140 meq/L (ref 135–145)
TCO2: 21 mmol/L (ref 0–100)

## 2010-10-25 LAB — DIFFERENTIAL
Basophils Absolute: 0 10*3/uL (ref 0.0–0.1)
Basophils Relative: 1 % (ref 0–1)
Lymphocytes Relative: 26 % (ref 12–46)
Monocytes Absolute: 0.3 10*3/uL (ref 0.1–1.0)
Neutro Abs: 2.2 10*3/uL (ref 1.7–7.7)
Neutrophils Relative %: 64 % (ref 43–77)

## 2010-10-25 LAB — PROTIME-INR: Prothrombin Time: 13.8 seconds (ref 11.6–15.2)

## 2010-10-25 LAB — POCT CARDIAC MARKERS: CKMB, poc: 1.3 ng/mL (ref 1.0–8.0)

## 2010-10-25 LAB — APTT: aPTT: 30 seconds (ref 24–37)

## 2010-10-26 LAB — CBC
HCT: 25 % — ABNORMAL LOW (ref 36.0–46.0)
HCT: 28.5 % — ABNORMAL LOW (ref 36.0–46.0)
Hemoglobin: 9.4 g/dL — ABNORMAL LOW (ref 12.0–15.0)
MCHC: 33.1 g/dL (ref 30.0–36.0)
MCV: 92.5 fL (ref 78.0–100.0)
Platelets: 187 10*3/uL (ref 150–400)
Platelets: 208 10*3/uL (ref 150–400)
RBC: 2.71 MIL/uL — ABNORMAL LOW (ref 3.87–5.11)
RDW: 15.4 % (ref 11.5–15.5)
WBC: 5.4 10*3/uL (ref 4.0–10.5)
WBC: 6.2 10*3/uL (ref 4.0–10.5)

## 2010-10-26 LAB — GLUCOSE, CAPILLARY
Glucose-Capillary: 154 mg/dL — ABNORMAL HIGH (ref 70–99)
Glucose-Capillary: 154 mg/dL — ABNORMAL HIGH (ref 70–99)
Glucose-Capillary: 208 mg/dL — ABNORMAL HIGH (ref 70–99)
Glucose-Capillary: 215 mg/dL — ABNORMAL HIGH (ref 70–99)
Glucose-Capillary: 224 mg/dL — ABNORMAL HIGH (ref 70–99)
Glucose-Capillary: 237 mg/dL — ABNORMAL HIGH (ref 70–99)
Glucose-Capillary: 347 mg/dL — ABNORMAL HIGH (ref 70–99)
Glucose-Capillary: 72 mg/dL (ref 70–99)

## 2010-10-26 LAB — URINALYSIS, ROUTINE W REFLEX MICROSCOPIC
Bilirubin Urine: NEGATIVE
Bilirubin Urine: NEGATIVE
Glucose, UA: NEGATIVE mg/dL
Nitrite: NEGATIVE
Nitrite: NEGATIVE
Specific Gravity, Urine: 1.005 (ref 1.005–1.030)
Specific Gravity, Urine: 1.016 (ref 1.005–1.030)
pH: 5 (ref 5.0–8.0)
pH: 6 (ref 5.0–8.0)

## 2010-10-26 LAB — DIFFERENTIAL
Basophils Absolute: 0 10*3/uL (ref 0.0–0.1)
Eosinophils Absolute: 0 10*3/uL (ref 0.0–0.7)
Eosinophils Relative: 0 % (ref 0–5)
Lymphs Abs: 2 10*3/uL (ref 0.7–4.0)
Neutrophils Relative %: 55 % (ref 43–77)

## 2010-10-26 LAB — BASIC METABOLIC PANEL
BUN: 18 mg/dL (ref 6–23)
BUN: 23 mg/dL (ref 6–23)
CO2: 21 mEq/L (ref 19–32)
Calcium: 8.2 mg/dL — ABNORMAL LOW (ref 8.4–10.5)
Chloride: 108 mEq/L (ref 96–112)
GFR calc non Af Amer: >60 mL/min (ref >60)
Glucose, Bld: 106 mg/dL — ABNORMAL HIGH (ref 70–99)
Potassium: 4.8 mEq/L (ref 3.5–5.1)

## 2010-10-26 LAB — COMPREHENSIVE METABOLIC PANEL
Albumin: 3 g/dL — ABNORMAL LOW (ref 3.5–5.2)
Alkaline Phosphatase: 97 U/L (ref 39–117)
BUN: 41 mg/dL — ABNORMAL HIGH (ref 6–23)
CO2: 21 mEq/L (ref 19–32)
Chloride: 101 mEq/L (ref 96–112)
Glucose, Bld: 298 mg/dL — ABNORMAL HIGH (ref 70–99)
Potassium: 5.3 mEq/L — ABNORMAL HIGH (ref 3.5–5.1)
Total Bilirubin: 0.7 mg/dL (ref 0.3–1.2)

## 2010-10-26 LAB — IRON AND TIBC
Iron: 57 ug/dL (ref 42–135)
Saturation Ratios: 41 % (ref 20–55)
TIBC: 140 ug/dL — ABNORMAL LOW (ref 250–470)

## 2010-10-26 LAB — TSH: TSH: 2.493 u[IU]/mL (ref 0.350–4.500)

## 2010-10-26 LAB — LIPID PANEL
Cholesterol: 119 mg/dL (ref 0–200)
LDL Cholesterol: 31 mg/dL (ref 0–99)

## 2010-10-26 LAB — HEMOGLOBIN A1C
Hgb A1c MFr Bld: 8.2 % — ABNORMAL HIGH (ref 4.6–6.1)
Mean Plasma Glucose: 189 mg/dL

## 2010-10-26 LAB — BRAIN NATRIURETIC PEPTIDE: Pro B Natriuretic peptide (BNP): 370 pg/mL — ABNORMAL HIGH (ref 0.0–100.0)

## 2010-10-26 LAB — CARDIAC PANEL(CRET KIN+CKTOT+MB+TROPI)
CK, MB: 1.9 ng/mL (ref 0.3–4.0)
CK, MB: 2.3 ng/mL (ref 0.3–4.0)
Total CK: 55 U/L (ref 7–177)

## 2010-10-26 LAB — URINE CULTURE

## 2010-10-26 LAB — CULTURE, BLOOD (ROUTINE X 2): Culture: NO GROWTH

## 2010-10-26 LAB — URINE MICROSCOPIC-ADD ON

## 2010-10-31 LAB — GLUCOSE, CAPILLARY
Glucose-Capillary: 106 mg/dL — ABNORMAL HIGH (ref 70–99)
Glucose-Capillary: 117 mg/dL — ABNORMAL HIGH (ref 70–99)
Glucose-Capillary: 127 mg/dL — ABNORMAL HIGH (ref 70–99)
Glucose-Capillary: 133 mg/dL — ABNORMAL HIGH (ref 70–99)
Glucose-Capillary: 140 mg/dL — ABNORMAL HIGH (ref 70–99)
Glucose-Capillary: 154 mg/dL — ABNORMAL HIGH (ref 70–99)
Glucose-Capillary: 162 mg/dL — ABNORMAL HIGH (ref 70–99)
Glucose-Capillary: 169 mg/dL — ABNORMAL HIGH (ref 70–99)
Glucose-Capillary: 188 mg/dL — ABNORMAL HIGH (ref 70–99)
Glucose-Capillary: 193 mg/dL — ABNORMAL HIGH (ref 70–99)
Glucose-Capillary: 224 mg/dL — ABNORMAL HIGH (ref 70–99)
Glucose-Capillary: 229 mg/dL — ABNORMAL HIGH (ref 70–99)
Glucose-Capillary: 233 mg/dL — ABNORMAL HIGH (ref 70–99)
Glucose-Capillary: 24 mg/dL — CL (ref 70–99)
Glucose-Capillary: 253 mg/dL — ABNORMAL HIGH (ref 70–99)
Glucose-Capillary: 301 mg/dL — ABNORMAL HIGH (ref 70–99)
Glucose-Capillary: 31 mg/dL — CL (ref 70–99)
Glucose-Capillary: 326 mg/dL — ABNORMAL HIGH (ref 70–99)
Glucose-Capillary: 33 mg/dL — CL (ref 70–99)
Glucose-Capillary: 347 mg/dL — ABNORMAL HIGH (ref 70–99)
Glucose-Capillary: 381 mg/dL — ABNORMAL HIGH (ref 70–99)
Glucose-Capillary: 51 mg/dL — ABNORMAL LOW (ref 70–99)
Glucose-Capillary: 61 mg/dL — ABNORMAL LOW (ref 70–99)
Glucose-Capillary: 61 mg/dL — ABNORMAL LOW (ref 70–99)
Glucose-Capillary: 64 mg/dL — ABNORMAL LOW (ref 70–99)
Glucose-Capillary: 65 mg/dL — ABNORMAL LOW (ref 70–99)
Glucose-Capillary: 70 mg/dL (ref 70–99)
Glucose-Capillary: 74 mg/dL (ref 70–99)
Glucose-Capillary: 82 mg/dL (ref 70–99)
Glucose-Capillary: 84 mg/dL (ref 70–99)
Glucose-Capillary: 86 mg/dL (ref 70–99)

## 2010-11-01 LAB — GLUCOSE, CAPILLARY
Glucose-Capillary: 114 mg/dL — ABNORMAL HIGH (ref 70–99)
Glucose-Capillary: 116 mg/dL — ABNORMAL HIGH (ref 70–99)
Glucose-Capillary: 122 mg/dL — ABNORMAL HIGH (ref 70–99)
Glucose-Capillary: 129 mg/dL — ABNORMAL HIGH (ref 70–99)
Glucose-Capillary: 142 mg/dL — ABNORMAL HIGH (ref 70–99)
Glucose-Capillary: 145 mg/dL — ABNORMAL HIGH (ref 70–99)
Glucose-Capillary: 154 mg/dL — ABNORMAL HIGH (ref 70–99)
Glucose-Capillary: 161 mg/dL — ABNORMAL HIGH (ref 70–99)
Glucose-Capillary: 174 mg/dL — ABNORMAL HIGH (ref 70–99)
Glucose-Capillary: 188 mg/dL — ABNORMAL HIGH (ref 70–99)
Glucose-Capillary: 193 mg/dL — ABNORMAL HIGH (ref 70–99)
Glucose-Capillary: 223 mg/dL — ABNORMAL HIGH (ref 70–99)
Glucose-Capillary: 223 mg/dL — ABNORMAL HIGH (ref 70–99)
Glucose-Capillary: 224 mg/dL — ABNORMAL HIGH (ref 70–99)
Glucose-Capillary: 234 mg/dL — ABNORMAL HIGH (ref 70–99)
Glucose-Capillary: 243 mg/dL — ABNORMAL HIGH (ref 70–99)
Glucose-Capillary: 279 mg/dL — ABNORMAL HIGH (ref 70–99)
Glucose-Capillary: 280 mg/dL — ABNORMAL HIGH (ref 70–99)
Glucose-Capillary: 342 mg/dL — ABNORMAL HIGH (ref 70–99)
Glucose-Capillary: 349 mg/dL — ABNORMAL HIGH (ref 70–99)
Glucose-Capillary: 41 mg/dL — ABNORMAL LOW (ref 70–99)
Glucose-Capillary: 41 mg/dL — ABNORMAL LOW (ref 70–99)
Glucose-Capillary: 60 mg/dL — ABNORMAL LOW (ref 70–99)
Glucose-Capillary: 65 mg/dL — ABNORMAL LOW (ref 70–99)
Glucose-Capillary: 65 mg/dL — ABNORMAL LOW (ref 70–99)
Glucose-Capillary: 72 mg/dL (ref 70–99)
Glucose-Capillary: 75 mg/dL (ref 70–99)
Glucose-Capillary: 82 mg/dL (ref 70–99)
Glucose-Capillary: 83 mg/dL (ref 70–99)
Glucose-Capillary: 85 mg/dL (ref 70–99)
Glucose-Capillary: 93 mg/dL (ref 70–99)
Glucose-Capillary: 99 mg/dL (ref 70–99)

## 2010-11-01 LAB — CBC
HCT: 29.4 % — ABNORMAL LOW (ref 36.0–46.0)
Hemoglobin: 10.3 g/dL — ABNORMAL LOW (ref 12.0–15.0)
Hemoglobin: 10.7 g/dL — ABNORMAL LOW (ref 12.0–15.0)
Hemoglobin: 10.7 g/dL — ABNORMAL LOW (ref 12.0–15.0)
MCHC: 34.4 g/dL (ref 30.0–36.0)
MCHC: 34.9 g/dL (ref 30.0–36.0)
MCV: 89.5 fL (ref 78.0–100.0)
MCV: 91.6 fL (ref 78.0–100.0)
Platelets: 218 10*3/uL (ref 150–400)
Platelets: 263 10*3/uL (ref 150–400)
Platelets: 278 10*3/uL (ref 150–400)
RBC: 2.84 MIL/uL — ABNORMAL LOW (ref 3.87–5.11)
RBC: 3.29 MIL/uL — ABNORMAL LOW (ref 3.87–5.11)
RBC: 3.42 MIL/uL — ABNORMAL LOW (ref 3.87–5.11)
RBC: 3.47 MIL/uL — ABNORMAL LOW (ref 3.87–5.11)
RDW: 15.7 % — ABNORMAL HIGH (ref 11.5–15.5)
RDW: 15.9 % — ABNORMAL HIGH (ref 11.5–15.5)
RDW: 15.9 % — ABNORMAL HIGH (ref 11.5–15.5)
WBC: 10.5 10*3/uL (ref 4.0–10.5)
WBC: 7.7 10*3/uL (ref 4.0–10.5)
WBC: 9.6 10*3/uL (ref 4.0–10.5)

## 2010-11-01 LAB — COMPREHENSIVE METABOLIC PANEL
ALT: 16 U/L (ref 0–35)
ALT: 26 U/L (ref 0–35)
ALT: 9 U/L (ref 0–35)
AST: 16 U/L (ref 0–37)
AST: 40 U/L — ABNORMAL HIGH (ref 0–37)
Albumin: 2.4 g/dL — ABNORMAL LOW (ref 3.5–5.2)
Albumin: 2.9 g/dL — ABNORMAL LOW (ref 3.5–5.2)
Alkaline Phosphatase: 76 U/L (ref 39–117)
Alkaline Phosphatase: 78 U/L (ref 39–117)
BUN: 13 mg/dL (ref 6–23)
CO2: 20 mEq/L (ref 19–32)
CO2: 23 mEq/L (ref 19–32)
Calcium: 8.2 mg/dL — ABNORMAL LOW (ref 8.4–10.5)
Chloride: 101 mEq/L (ref 96–112)
Creatinine, Ser: 0.84 mg/dL (ref 0.4–1.2)
GFR calc Af Amer: 25 mL/min — ABNORMAL LOW (ref >60)
GFR calc non Af Amer: 21 mL/min — ABNORMAL LOW (ref >60)
GFR calc non Af Amer: >60 mL/min (ref >60)
Glucose, Bld: 197 mg/dL — ABNORMAL HIGH (ref 70–99)
Glucose, Bld: 83 mg/dL (ref 70–99)
Potassium: 5.2 mEq/L — ABNORMAL HIGH (ref 3.5–5.1)
Potassium: 6.2 mEq/L — ABNORMAL HIGH (ref 3.5–5.1)
Sodium: 137 mEq/L (ref 135–145)
Total Bilirubin: 0.6 mg/dL (ref 0.3–1.2)
Total Protein: 5.4 g/dL — ABNORMAL LOW (ref 6.0–8.3)
Total Protein: 6 g/dL (ref 6.0–8.3)

## 2010-11-01 LAB — DIFFERENTIAL
Basophils Absolute: 0 10*3/uL (ref 0.0–0.1)
Basophils Relative: 0 % (ref 0–1)
Basophils Relative: 0 % (ref 0–1)
Eosinophils Absolute: 0 10*3/uL (ref 0.0–0.7)
Eosinophils Absolute: 0.1 10*3/uL (ref 0.0–0.7)
Eosinophils Relative: 1 % (ref 0–5)
Eosinophils Relative: 1 % (ref 0–5)
Monocytes Absolute: 0.4 10*3/uL (ref 0.1–1.0)
Monocytes Absolute: 0.7 10*3/uL (ref 0.1–1.0)
Monocytes Relative: 4 % (ref 3–12)
Neutrophils Relative %: 77 % (ref 43–77)

## 2010-11-01 LAB — BASIC METABOLIC PANEL
BUN: 11 mg/dL (ref 6–23)
BUN: 17 mg/dL (ref 6–23)
CO2: 19 mEq/L (ref 19–32)
Calcium: 8.2 mg/dL — ABNORMAL LOW (ref 8.4–10.5)
Calcium: 8.3 mg/dL — ABNORMAL LOW (ref 8.4–10.5)
Calcium: 8.5 mg/dL (ref 8.4–10.5)
Creatinine, Ser: 0.88 mg/dL (ref 0.4–1.2)
GFR calc Af Amer: 34 mL/min — ABNORMAL LOW (ref >60)
GFR calc Af Amer: >60 mL/min (ref >60)
GFR calc non Af Amer: 28 mL/min — ABNORMAL LOW (ref >60)
GFR calc non Af Amer: >60 mL/min (ref >60)
Glucose, Bld: 183 mg/dL — ABNORMAL HIGH (ref 70–99)
Sodium: 130 mEq/L — ABNORMAL LOW (ref 135–145)
Sodium: 136 mEq/L (ref 135–145)

## 2010-11-01 LAB — POCT I-STAT 4, (NA,K, GLUC, HGB,HCT)
HCT: 38 % (ref 36.0–46.0)
Hemoglobin: 12.9 g/dL (ref 12.0–15.0)
Sodium: 134 mEq/L — ABNORMAL LOW (ref 135–145)

## 2010-11-01 LAB — PROTIME-INR: Prothrombin Time: 14.5 seconds (ref 11.6–15.2)

## 2010-11-29 NOTE — Consult Note (Signed)
NAMEEFFIE, WAHLERT NO.:  [id number]   MEDICAL RECORD NO.:  [id number]          PATIENT TYPE:  INP   LOCATION:  2915                         FACILITY:  [facility]   PHYSICIAN:  Anselm Pancoast. Weatherly, M.D.DATE OF BIRTH:  [birth year]   DATE OF CONSULTATION:  07/01/2008  DATE OF DISCHARGE:                                 CONSULTATION   REQUESTING PHYSICIAN:  Marcellus Scott, MD   REASON FOR CONSULTATION:  Questionable acute cholecystitis.   HISTORY OF PRESENT ILLNESS:  Ms. Kemmerer is a 77 year old female  patient with history of diabetes mellitus, CAD, recent MI in March 2009  currently on Plavix.  She was admitted on June 26, 2008 with  diabetic ketoacidosis and apparent sepsis.  She was later found to have  E. coli UTI and bacteremia resistant to ampicillin and she is currently  on Rocephin IV antibiotic therapy.  Since admission, her white count has  actually increased and the patient has been complaining of diffuse  abdominal pain, but points to her lower abdomen.  She has also had  associated nausea.  Dr. Waymon Amato was concerned and obtain a CT of the  abdomen and pelvis which actually revealed a massively distended  gallbladder with thickened wall and layering stones at the neck of the  gallbladder.  The patient has had mild transaminitis with elevated  alkaline phosphate consistent with possible biliary obstruction and  given her symptoms, surgical evaluation has been requested regarding  acute cholecystitis issues.   REVIEW OF SYSTEMS:  As per the history of present illness.  History was  obtained from current chart, talking with Dr. Waymon Amato and review old  records.  The patient has dementia and is a poor historian.   PAST MEDICAL HISTORY:  1. Early Alzheimer dementia.  2. Chronic anemia.  3. Paroxysmal atrial flutter.  4. CHF with history of diastolic dysfunction normal EF 65%.  5. Moderate to severe TR.  6. Known CAD with recent MI in March 2009  currently on Plavix.  7. Insulin-dependent diabetes mellitus uncontrolled prior to      admission.  8. Hypertension.  9. Dyslipidemia.  10.Hypothyroidism with normal TSH this admission.  11.Chronic renal insufficiency.   PAST SURGICAL HISTORY:  She has had a hysterectomy for postmenopausal  bleeding in 2008.  Coronary artery bypass grafting surgery.   SOCIAL HISTORY:  No alcohol.  No tobacco.  She lives with her daughter.   FAMILY MEDICAL HISTORY:  Noncontributory.   ALLERGIES:  PENICILLIN.   CURRENT MEDICATIONS:  Since admission include aspirin, Detrol,  Lopressor, Namenda, Plavix, Synthroid, Zocor, Lantus insulin, subcu  heparin for DVT prophylaxis, Norvasc, Senokot, Protonix, lisinopril is  currently on hold, sliding scale insulin, nystatin, Rocephin IV and  various p.r.n. medications for symptom management.   PHYSICAL EXAMINATION:  GENERAL:  Pleasant but mildly lethargic female  patient complaining of diffuse abdominal pain points to her lower  abdomen.  VITAL SIGNS:  T-max 99, BP 127/49, pulse 70 and slightly irregular,  respirations 20.  PSYCH:  The patient is alert but is oriented only to name, uncertain of  the situation,  year or time.  Short-term memory is down.  She did not  remember that she has had a hysterectomy in 2008.  When I questioned her  about the lower abdominal scar, she could only recall her CABG surgery.  NEURO:  Cranial nerves II-XII are grossly intact.  She is moving all  extremities x4 but weakly, strength is about 4/5 bilaterally.  No focal  neurological deficits are noted.  Sclerae are slightly injected,  nonicteric.  EARS, NOSE AND THROAT:  Ears are symmetrical.  No otorrhea.  Nose is  midline.  No rhinorrhea.  Oral mucous membranes are pink and moist.  CHEST:  Bilateral lung sounds are clear to auscultation but coarse  respiratory effort is nonlabored.  She is on nasal cannula O2.  CARDIOVASCULAR:  Heart sounds are S1-S2, did not appreciate any  murmurs  but given TR she could have a subtle systolic murmur.  No JVD.  Pulse is  slightly irregular with AFib/flutter noted on bedside tele as well as on  this morning's EKG flutter waves best seen in lead V1.  She is currently  on Cardizem drip IV for rate control, ventricular rate currently is in  the 70s.  ABDOMEN:  Soft, slightly distended.  Bowel sounds are present but  diminished.  She is tender in the epigastrium not in the right upper  quadrant.  No guarding, no rebounding.  No obvious hepatosplenomegaly,  masses or bruits.  She does have a midline incision below the umbilicus  to symphysis pubis that is healed without any herniations or ventral  wall disruptions.  EXTREMITIES:  Symmetrical in appearance without cyanosis or clubbing  except for right upper extremity swelling above the olecranon  correlating with PICC insertion site.   LABORATORY DATA:  White count is now up to 16,200, hemoglobin 9.7,  platelets 223,000.  Sodium 131, potassium 3.4, CO2 19, glucose 228, BUN  25, creatinine 1.49.  BNP for today is pending.  Hemoglobin A1c was  10.7.  AST at admission was 41, peaked at 66 and is now 28, ALT 15,  peaked at 15 now is 30, alkaline phosphatase was 79 at admission and it  peaked at 251 now down to 203, total bilirubin has been less than 1 the  entire admission and albumin uncorrected is 1.5.  Diagnostic CT of the  abdomen and pelvis as noted in the history of present illness.  Ultrasound of the abdomen is pending.   IMPRESSION:  1. Cholelithiasis with abdominal pain, probable acute cholecystitis.  2. Known history of coronary artery disease prior myocardial      infarction in March 2009 on Plavix.  3. Escherichia coli urinary tract infection and bacteremia resistant      to ampicillin.  4. Insulin-dependent diabetes mellitus, poor control prior to      admission.  5. Paroxysmal atrial fibrillation not a Coumadin candidate secondary      to mental status and  apparent falls.  6. History of congestive heart failure secondary to diastolic      dysfunction with preserved left ventricular ejection fraction,      moderate to severe tricuspid regurgitation on prior echo.  7. Questionable deep venous thrombosis in the right upper extremity at      PICC site.   PLAN:  1. Suspect leukocytosis and gastrointestinal symptoms related to      biliary etiology, i.e. cholecystitis.  2. The patient is currently on appropriate antibiotics for UTI      treatment.  These should  also cover the biliary tract suspect      progressive leukocytosis due to probable cystic duct obstruction,      possible choledocholithiasis.  We will continue to follow.  Need to      check an amylase and lipase as well to rule out associated biliary      pancreatitis.  Await Cardiology input regarding timing and      readiness for operative intervention whether this admission or      later.  If she is not ready this admission or if she continues to      experience progressive leukocytosis while we wait for therapeutic      effects of Plavix to decrease, the patient may need a percutaneous      cholecystotomy tube to temporize.  3. Recommend n.p.o. status except for clear liquids.  Again, we may or      may not need to broaden antibiotic coverage.  The patient has been      allergic and is on Rocephin at the present time.  4. The patient has low albumin probably multifactorial, check a      prealbumin level.      Allison L. Rennis Harding, N.P.    ______________________________  Anselm Pancoast. Zachery Dakins, M.D.    ALE/MEDQ  D:  07/01/2008  T:  07/01/2008  Job:  [id number]   cc:   Marcellus Scott, MD

## 2010-11-29 NOTE — Letter (Signed)
December 18, 2007    Annia Friendly. Loleta Chance, MD  [address], [address]  [place], [place] [address]   RE:  PAYSLIE, MCCAIG  MRN:  [id number]  /  DOB:  [birth year]   Dear Earvin Hansen:   Ms. Inoue returns to the office after her third admission to [facility] in recent months.  She presented first with a non-Q MI for  which two vein grafts were stented.  She returned with recurrent chest  pain, and repeat coronary angiography showed her stents to be patent.  She returned a third time with congestive heart failure and responded to  low dose diuretic.  She continues to reside at [facility].  She has had no chest discomfort, dyspnea nor ankle edema.   MEDICATIONS:  Are unchanged from her last visit except for the addition  of Lasix 20 mg daily.   PHYSICAL EXAMINATION:  GENERAL:  On exam, a pleasant older woman in no  acute distress.  VITAL SIGNS:  The weight is 139, 3 pounds more than in April.  Blood  pressure 125/70, heart rate 70 and regular, respirations 14.  NECK:  No jugular venous distention; left carotid bruit present.  LUNGS:  Few coarse rales at the left base.  CARDIAC:  Normal first and second heart sounds; minimal basilar systolic  murmur.  ABDOMEN:  Soft and nontender; no bruits; no organomegaly.  EXTREMITIES:  Trace edema.   Most recent chemistry profile was from May 15 and was essentially normal  with a slightly decreased serum sodium of 134, potassium of 4.8 and  creatinine of 1.36.  Glucose was 388.   IMPRESSION:  Ms. Blakley appears to have stabilized with respect to her  cardiac issues.  Congestive heart failure with preserved left  ventricular systolic function now appears controlled.  She has no  symptoms to suggest recurrent myocardial ischemia.  We will check a  chemistry profile, BNP level and chest x-ray.  If results are good, I  will plan to see this nice woman again in 4 months.  Lipid profile was  fairly good when checked just 2 months ago with  total cholesterol 184,  triglycerides of 48, HDL of 90 and LDL of 84.  It appears that she needs  additional attention to diabetic management.    Sincerely,      Gerrit Friends. Dietrich Pates, MD, [facility]  Electronically Signed    RMR/MedQ  DD: 12/18/2007  DT: 12/18/2007  Job #: [id number]   CC:    Lenon Curt. Chilton Si, M.D.

## 2010-11-29 NOTE — Assessment & Plan Note (Signed)
[facility]                       [place] CARDIOLOGY OFFICE NOTE   NAME:[name]                       MRN:          [id number]  DATE:08/14/2008                            DOB:          Mar 02, 1927    CARDIOLOGIST:  Gerrit Friends. Dietrich Pates, MD.   PRIMARY CARE PHYSICIAN:  Annia Friendly. Hill, MD.   REASON FOR VISIT:  Post-hospitalization followup.   HISTORY OF PRESENT ILLNESS:  [name] is a 77 year old female  patient with a history of coronary disease status post bypass surgery in  1995 who suffered a non-ST-elevation myocardial infarction in March 2009  and treated with a Promus drug-eluting stent to the vein graft to the  diagonal and a Taxus drug-eluting stent to the vein graft to the obtuse  marginal with overall preserved LV function.  The patient last underwent  cardiac catheterization April 2009 that demonstrated severe native three-  vessel disease, patent bypass grafts, patent stents in the vein grafts  and preserved LV function.  Medical therapy was continued at that time.  Apparently she has been admitted to the hospital several times with  diabetic ketoacidosis and other issues.  She was recently seen by Dr.  Gala Romney in consultation July 01, 2008 when she was admitted to  [facility] in the setting of sepsis thought to be secondary to  a combination of pyelonephritis and possibly cholecystitis.  The patient  has cholelithiasis and she ultimately underwent cholecystostomy tube  placement.  She still has that tube in place now.  The ultimate plan is  to proceed with cholecystectomy pending cardiac clearance.  Actually,  the patient was cleared by Dr. Gala Romney during her hospitalization.  It  was felt that she was at mild-moderate risk for perioperative  complications at that time.  It was felt that she could safely come off  of Plavix in the short-term for her procedure.  She is now back on  Plavix since her drain has  been placed.  She denies any chest pain.  She  does have shortness breath with exertion, but she is quite limited in  her activity secondary to deconditioning and weakness.  She denies  orthopnea or PND.  She denies pedal edema.  She denies fevers or chills.   CURRENT MEDICATIONS:  Plavix 75 mg daily, Norvasc 10 mg daily, Namenda  10 mg in the morning, Synthroid 50 mcg daily, Metoprolol 50 mg b.i.d.,  Zocor 40 mg q.h.s., Aspirin 81 mg daily,  Lisinopril 20 mg daily,  Humalog sliding-scale insulin, Detrol 4 mg daily, Lasix 20 mg daily,  Protonix 40 mg daily, Celexa 10 mg daily, Cardizem CD 120 mg daily,  Vicodin p.r.n.   ALLERGIES:  PENICILLIN.   SOCIAL HISTORY:  She denies tobacco abuse.   PHYSICAL EXAMINATION:  GENERAL:  She is a chronically ill-appearing  female arriving in a wheelchair in no acute distress.  VITAL SIGNS:  Blood pressure 118/63, pulse 74, weight 116 pounds.  HEENT:  Normal.  NECK:  Without JVD at 90 degrees.  CARDIAC:  Normal S1 and S2, regular rate and rhythm.  LUNGS:  Decreased breath sounds  bilaterally.  No wheezing, no rales.  ABDOMEN:  Normoactive bowel sounds, soft.  Cholecystostomy drain is in  place.  EXTREMITIES:  Without edema.  NEUROLOGIC:  She is alert and oriented x3.  Cranial nerves II-XII are  grossly intact.   DIAGNOSTICS:  Electrocardiogram reveals sinus rhythm with a heart rate  of 75, rightward axis, poor R-wave progression, nonspecific ST/T wave  changes, first-degree AV block.   ASSESSMENT/PLAN:  1. Coronary artery disease in a 78 year old female with a history of      bypass surgery who underwent drug-eluting stent placement to the      vein graft to the diagonal, vein graft to the obtuse marginal in      March 2009 in the setting of non-ST-elevation myocardial infarction      with overall preserved left ventricular function and recent nuclear      study in September 2009 that was felt to be overall low risk.  She      remains on Plavix  and aspirin secondary to her drug-eluting stents      in her vein grafts.  The patient was also interviewed and examined      by Dr. Dietrich Pates today.  He spoke to Dr. Zachery Dakins over the      telephone.  She again is at mild-moderate risk for perioperative      complications (including stent thrombosis).  She is now 10 months      out since her PCI.  The patient can come off of Plavix in the      perioperative period.  Dr. Zachery Dakins will get in touch with the      patient to determine the timing of when this should be stopped.  It      should be restarted postoperatively when it is felt to be safe from      a surgical standpoint.  Our cardiology service is certainly      available in the perioperative period as necessary.  2. Atrial arrhythmias with a history of atrial flutter status post      ablation and paroxysmal atrial fibrillation.  She has been deemed      an inappropriate candidate for Coumadin in the past.  3. Hypertension.  This is overall well controlled.  4. Dyslipidemia.  She continues on Zocor.  5. Diabetes mellitus.  She continues to follow up with Dr. Loleta Chance.  6. Cholelithiasis.  As outlined above, she is pending cholecystectomy.  7. Dementia.   DISPOSITION:  The patient will be brought back in follow up with Dr.  Dietrich Pates in 1 month.  As noted above, our cardiology service will  certainly be available in the perioperative period as necessary.      Tereso Newcomer, PA-C  Electronically Signed      Gerrit Friends. Dietrich Pates, MD, [facility]  Electronically Signed   SW/MedQ  DD: 08/14/2008  DT: 08/14/2008  Job #: [id number]   cc:   Gerrit Friends. Dietrich Pates, MD, [facility]  Annia Friendly. Loleta Chance, MD  Carlota Raspberry, MD

## 2010-11-29 NOTE — Discharge Summary (Signed)
NAMESHARA, HARTIS NO.:  [id number]   MEDICAL RECORD NO.:  [id number]          PATIENT TYPE:  INP   LOCATION:  3740                         FACILITY:  [facility]   PHYSICIAN:  Pricilla Riffle, MD, FACCDATE OF BIRTH:  Aug 09, 1926   DATE OF ADMISSION:  10/07/2007  DATE OF DISCHARGE:  10/14/2007                               DISCHARGE SUMMARY   DISCHARGE DIAGNOSES:  1. Non-ST elevated myocardial infarction.  2. Progressive coronary artery disease with drug-eluting stents as      described above.  3. Hypertension.  4. Hyperglycemia and a history of diabetes that is poorly-controlled.  5. Abnormal urinalysis.  6. Dementia.  7. History as noted below.   PROCEDURES PERFORMED:  Cardiac catheterization on October 09, 2007, by Dr.  Antoine Poche, and status post drug-eluting stent to the saphenous vein graft  to the posterolateral and a saphenous vein graft to the diagonal on  March 26, by Everardo Beals. Juanda Chance, MD.   SUMMARY OF HISTORY:  [name] is a 77 year old African American  female who while making the bed developed chest discomfort and shortness  of breath that radiated to the right arm, associated with weakness and  dizziness.  With rest her symptoms improved.  However, she went to her  primary care physician.  He felt that she was very weak and thus he  referred her to the emergency room.  She also noted symptoms on the day  prior to admission.   PAST MEDICAL HISTORY:  1. Also notable for diabetes that is not well-controlled at home.  2. CHF.  3. Renal insufficiency.  4. Atrial flutter with ablation.  5. Dyslipidemia.  6. Known coronary artery disease with bypass surgery in 1998.   LABORATORY DATA:  On admission EKG showed normal sinus rhythm, left axis  deviation, delayed R wave, nonspecific ST-T wave changes.  Subsequent  EKGs were essentially the same.  Chest x-ray showed no acute findings,  post bypass surgery.  Admission H&H 11.9 and 35.0, normal indices,  platelets 125, WBC 9.5.  Last CBC was on the 29th, showed an H&H of 9.8  and 28.6, normal indices, platelets 137, WBC 5.1.  Admission PTT was 73,  PT 13.1.  D-dimer 0.32.  Sodium 132, potassium 4.6, BUN 54, creatinine  1.73, glucose 146.  Normal LFTs.  At time of discharge on the 30th  sodium was 133, potassium 5.0, BUN 13, creatinine 1.15, glucose 267.  Initial CK-MB 260 and 10.2 with relative index of 3.9.  Subsequent CK,  MBs relative indices were declining.  Admission troponin was 2.71.  Subsequent troponins were declining.  Postprocedure CK-MB was within  normal limits.  Fasting lipids on the 24th showed a total cholesterol  157, triglycerides 57, HDL 76, LDL of 70.  Urinalysis on March 29 was  abnormal in regards to glucose, blood, leukocytes and many bacteria.  Urine culture is pending at the time of this dictation.  It was also  noted that her sugars were variable throughout her stay in the mid 100s  to over 400.  Echocardiogram on the 24th revealed an ejection  fraction  of 55-66% without wall motion abnormalities, moderate LVH, mild MAC,  left atrial enlargement.   HOSPITAL COURSE:  The patient was seen by Bettey Mare. Lyman Bishop, NP, and  Gerrit Friends. Rothbart, MD, and admitted to [facility].  She was placed  on IV heparin.  Pharmacy assisted with management.  Diabetes treatment  program made recommendations in regards to Lantus and decrease her  insulin coverage to a moderate scale.  Echocardiogram was performed with  the previously-mentioned results.  Dr. Eden Emms on review on the 24th felt  that she did rule in for myocardial infarction.  Catheterization was  performed on the 25th by Dr. Antoine Poche.  He noted that her LIMA to the  LAD was patent, saphenous vein graft to the diagonal had has a mid 90%  lesions, and the saphenous vein graft to the PDA had a mid 25% lesion.  The saphenous vein graft to the OM-1 and OM-2 had a 99% lesion just  prior to the OM-2.  Left ventriculogram was  not performed given her  renal insufficiency.  Findings were reviewed with Dr. Excell Seltzer and it was  felt that given her recurring chest discomfort, she should undergo PCI.  Dr. Juanda Chance on March 26 performed drug-eluting stents to the saphenous  vein graft to the PL and the saphenous vein graft to the diagonal.  She  was placed in the ADEPT DES trial.  [facility] assisted  with discharge needs such as at [facility], PT and OT.  Dr. Juanda Chance noted that  her blood sugar was 571 on the 27th despite being on a sliding scale.  It was felt that she should have home assistance.  Dr. Juanda Chance felt the  case manager may need to fill out assistance forms for Plavix.  Cardiac  rehab assisted with ambulation and education.  The diabetes coordinator  talked with the patient about her care of her diabetes at home.  Again  the diabetes coordinator recommended consideration of Lantus at home.  He also fell home health made need to assess the patient's capability of  giving and drawing up own insulin.  This was added to [facility]'s follow-up.  Dr. Antoine Poche commented about obtaining internal  medicine consult for reviewing her sugars.  However, it does not appear  that this was done prior to her being discharged by Dr. Tenny Craw.  Nursing  assisted with ambulation due to her unsteady gait.  By the 30th Dr. Tenny Craw  felt that the patient could be discharged home.   DISPOSITION:  The patient is discharged home and reminded to maintain a  low-sodium, heart-healthy ADA diet.  Activities will be followed by PT  and OT and home health.  Home health will also continue to monitor  sugars and keep in contact with Dr. Chestine Spore, her endocrinologist, for  further regulation.  Diabetes team here recommends Lantus in addition to  her sliding scale.  Wound care is per supplemental sheet post  catheterization.   New prescriptions have been written for:  1. Norvasc 5 mg daily.  2. Plavix 75 mg daily.  3. Zocor 40 mg  nightly.  4. Nitroglycerin 0.4 mg as needed.   She was asked to continue her:  1. Namenda 10 mg b.i.d.  2. Metoprolol 50 mg b.i.d.  3. Lisinopril 20 mg daily.  4. Her sliding scale NovoLog.   She will also begin aspirin 325 mg daily.   She is asked to maintain a blood pressure and sugar diary  and to bring  the blood pressure and sugar diary to Dr. Loleta Chance and to Dr. Chestine Spore for  further regulation of her medications.  She was also asked to bring all  medications to all appointments.  Given her normal LV function, Dr. Tenny Craw  asked her to discontinue her Lasix and her potassium.   DISCHARGE TIME:  45 minutes.      Joellyn Rued, PA-C      Pricilla Riffle, MD, [facility]  Electronically Signed    EW/MEDQ  D:  10/14/2007  T:  10/14/2007  Job:  [id number]   cc:   Annia Friendly. Loleta Chance, MD  Gerrit Friends. Dietrich Pates, MD, [facility]  Margaretmary Bayley, M.D.

## 2010-11-29 NOTE — Letter (Signed)
May 08, 2007    [name]. Jaclyn Chance, MD  [address]  [place], [place] [address]   RE:  Jaclyn, [name]  MRN:  [id number]  /  DOB:  [birth year]   Dear [name]:   [name] returned to see me, having previously been followed by Dr.  Anthonette Legato. She is doing well from a cardiac standpoint, now 13 years  following CABG surgery. She is here for clearance regarding a  gynecological procedure that is to be performed. She has history of  atrial flutter that was apparently cured with an ablation procedure. She  has multiple vascular risk factors including diabetes, hypertension, and  dyslipidemia that have apparently been under good medical control.   CURRENT CARDIAC MEDICATIONS:  1. Lovastatin 10 mg daily.  2. Metoprolol 50 mg b.i.d.  3. Furosemide 60 mg daily.  4. Isosorbide mononitrate 90 mg daily.  5. Lisinopril 20 mg daily.  6. Quinapril 10 mg daily.  7. Insulin.   PHYSICAL EXAMINATION:  GENERAL:  Trim pleasant woman in no acute  distress.  VITAL SIGNS:  Weight 135 pounds, 4 pounds less than in April 2007. Blood  pressure 145/75, heart rate 60 and regular, respirations 16.  NECK:  No jugular venous distension; no carotid bruits.  LUNGS:  Clear.  CARDIAC:  Normal first and second sounds; fourth heart sound present.  ABDOMEN:  Soft and nontender; no organomegaly.  EXTREMITIES:  No edema; distal pulses intact.   Laboratory was recently obtained in your office. We are seeking copies.   IMPRESSION:  [name] continues to do well with no clinical evidence  for recurrent myocardial ischemia and normal LV systolic function when  last assessed in 2007. The procedure that is proposed is low risk. No  special precautions nor further testing is warranted.   Her medications are suboptimal. She is taking two ACE inhibitors -  Quinapril will be discontinued. Isosorbide mononitrate is probably not  offering any benefit and will be discontinued. Her dose of Furosemide  will be  reduced to 40 mg daily. She could probably use more statin - we  will defer that decision until a lipid profile is available. I will add  amlodipine 5 mg daily for better control of blood pressure. She will  return in one month to see the cardiology nurses and in four months to  see me.    Sincerely,      Gerrit Friends. Dietrich Pates, MD, [facility]  Electronically Signed    RMR/MedQ  DD: 05/08/2007  DT: 05/09/2007  Job #: [id number]   CC:    Dois Davenport A. Rivard, M.D.

## 2010-11-29 NOTE — Discharge Summary (Signed)
[name], [name]:  [id number]   MEDICAL RECORD NO.:  [id number]          PATIENT TYPE:  INP   LOCATION:  5503                         FACILITY:  [facility]   PHYSICIAN:  Ladell Pier, M.D.   DATE OF BIRTH:  01-06-1927   DATE OF ADMISSION:  08/19/2007  DATE OF DISCHARGE:  08/23/2007                               DISCHARGE SUMMARY   DISCHARGE DIAGNOSES:  1. Weakness.  2. Hyperglycemia.  3. Dehydration.  4. History of congestive heart failure.  5. Renal insufficiency.  6. Coronary artery disease, status post coronary artery bypass graft      x4 in 1998.   DISCHARGE MEDICATIONS:  1. Sliding-scale insulin, NovoLog.  2. Lantus 35 units in the a.m.  3. Lovastatin 10 mg at bedtime.  4. Namenda 10 mg twice daily.  5. Lisinopril 20 mg daily.  6. Metoprolol 50 mg twice daily.  7. Lasix 60 mg daily.  8. Potassium chloride 20 mEq daily.  9. Isosorbide mononitrate 60 mg daily.   FOLLOW-UP APPOINTMENTS:  Patient is to follow up with Dr. Loleta Chance in a  week.   PROCEDURES:  None.   CONSULTS:  None.   HISTORY OF PRESENT ILLNESS:  Patient is a 77 year old African-American  female with a history of heart disease, CABG in 1998.  On admission, she  was very vague as to what brought her to the hospital.  She indicated  that over the past 4-5 days, she has been feeling progressively more  weak and dehydrated.  She stated that her appetite had declined over the  past several days.  She complained that she has had no nausea or  vomiting.  She has been compliant with her insulin.  No other medical  problems noted.   Past medical history, family history, social history, meds, allergies,  and review of systems per admission H&P.   PHYSICAL EXAMINATION ON DISCHARGE:  Temperature 98.9, pulse 74,  respirations 18, blood pressure 136/63.  Pulse ox 100% on room air.  CBG  142-161.  HEENT:  Head is normocephalic and atraumatic.  Pupils are reactive to  light.  Throat without  erythema.  CARDIOVASCULAR:  Regular rate and rhythm.  LUNGS:  Clear bilaterally.  ABDOMEN:  Positive bowel sounds.  EXTREMITIES:  Without edema.   HOSPITAL COURSE:  1. Hyperglycemia:  Patient was admitted to the hospital.  CBG was      elevated.  She was treated with insulin, Lantus, and sliding scale.      Diabetes coordinator was consulted, and she received information on      diabetes management.  She was followed as an outpatient with the      diabetes center.   1. Dehydration:  She received IV fluids, and the dehydration resolved.   1. Anemia:  She has a history of anemia that is stable.   1. Acute renal failure:  Creatinine was elevated on admission, but      that resolved after hydration.   1. Weakness:  Patient received physical therapy.  The recommendation      was for patient to go to  a skilled nursing facility, short-term      rehab, but she refused.  The patient will be discharged home with      her daughter.   LABS AT DISCHARGE:  Sodium 134, potassium 4.4, chloride 105, CO2 24,  glucose 107, BUN 15, creatinine 1.06, calcium 8.5.  LVC 5.7, hemoglobin  10.9, platelets 143.  MCV 94.4.  B12 730.  Free T4 1.07.  Hemoglobin A1C  11.3.      Ladell Pier, M.D.  Electronically Signed     [place]/MEDQ  D:  08/23/2007  T:  08/24/2007  Job:  [id number]   cc:   Annia Friendly. Loleta Chance, MD

## 2010-11-29 NOTE — Discharge Summary (Signed)
NAMELONEY, PETO NO.:  [id number]   MEDICAL RECORD NO.:  [id number]          PATIENT TYPE:  INP   LOCATION:  5526                         FACILITY:  [facility]   PHYSICIAN:  Charlestine Massed, MDDATE OF BIRTH:  October 26, 1926   DATE OF ADMISSION:  06/25/2008  DATE OF DISCHARGE:  07/21/2008                               DISCHARGE SUMMARY   PRIMARY CARE PHYSICIAN:  Dr. Mirna Mires.   PRIMARY CARDIOLOGIST:  Dr. Dietrich Pates.   Dictation ended at this point.      Charlestine Massed, MD     UT/MEDQ  D:  07/21/2008  T:  07/21/2008  Job:  [id number]

## 2010-11-29 NOTE — Consult Note (Signed)
[name], [name]:  [id number]   MEDICAL RECORD NO.:  [id number]          PATIENT TYPE:  INP   LOCATION:  2915                         FACILITY:  [facility]   PHYSICIAN:  Bevelyn Buckles. Bensimhon, MDDATE OF BIRTH:  Dec 23, 1926   DATE OF CONSULTATION:  07/01/2008  DATE OF DISCHARGE:                                 CONSULTATION   REFERRING PHYSICIAN:  [facility].   PRIMARY CARE PHYSICIAN:  Annia Friendly. Loleta Chance, MD.   CARDIOLOGIST:  Gerrit Friends. Dietrich Pates, MD, [facility]   REASON FOR CONSULTATION:  Preop evaluation with specific attention to  whether or not Plavix can be stopped.   HISTORY OF PRESENT ILLNESS:  [name] is a complicated 77 year old  woman with multiple medical problems including hypertension,  hyperlipidemia, diabetes and early dementia, paroxysmal atrial  fibrillation and coronary artery disease status post bypass surgery in  the 1990s.  She was admitted back in March with a non-ST elevation  myocardial infarction.  She underwent catheterization which showed  severe three vessel native coronary artery disease and the LIMA was  widely patent, the saphenous vein graft to the diagonal had a mid 90%  stenosis, the saphenous vein graft to the PDA had a 25% stenosis, the  sequential saphenous vein graft to the OM1 and OM2 had a 99% lesion in  the second limb.  She subsequently underwent percutaneous intervention  on the vein graft to the diagonal with a Promus drug-eluting stent.  She  then underwent percutaneous intervention to the saphenous vein graft to  the OM2 with a Taxus drug-eluting stent with good outcome.   Since that time she has had multiple admissions.  She underwent relook  catheterization in April of 2009 for discomfort.  This showed widely  patent stents.  She has had multiple admissions since that time, several  admissions since that time for chest pain with normal cardiac markers.  She was recently admitted with DKA and had mildly  elevated markers as  well as paroxysmal atrial fibrillation and was treated medically and did  well.  She is now readmitted to the hospitalist service with mild DKA  and fever.  She has been evaluated by [facility] including  a CT scan which showed possible cholecystitis.  We are asked to comment  on her preoperative risk and her ability to be discontinued from Plavix.   REVIEW OF SYSTEMS:  She denies any current chest pain or shortness of  breath.  No orthopnea, no PND, no lower extremity edema.  She has not  had a history of stroke.  Remainder of past medical history is as per  HPI and problem list, otherwise all systems negative.   PROBLEM LIST:  1. Coronary artery disease.      a.     Status post bypass surgery in 1990s.      b.     Status post non-ST elevation myocardial infarction in March       of 2009 with a Taxus drug-eluting stent to the saphenous vein       graft to the OM and a  Promus drug-eluting stent to the saphenous       vein graft to the diagonal.      c.     Most recent cardiac catheterization in April of 2009 showed       patent stents.      d.     Ejection fraction 65% by echocardiogram in November of 2009.  2. Hypertension.  3. Hyperlipidemia.  4. History of paroxysmal atrial fibrillation thought not to be a good      Coumadin candidate.  5. Diabetes with poor control.  6. Gastroesophageal reflux disease.  7. Early dementia.  8. Anemia.   CURRENT MEDICATIONS:  1. Amlodipine 10 mg a day.  2. Aspirin 325 a day.  3. Ceftriaxone.  4. Plavix.  5. Diltiazem 30 mg q.6 hours.  6. Heparin subcutaneous.  7. Insulin.  8. Synthroid.  9. Lopressor 50 b.i.d.  10.Protonix 40 daily.  11.Potassium 40 a day.  12.Zocor 40 a day.  13.Detrol.  14.Hydralazine p.r.n.   ALLERGIES:  PENICILLIN.   SOCIAL HISTORY:  She lives with her daughter.  She was previously in  [facility].  She denies tobacco or alcohol.   FAMILY HISTORY:  Is  noncontributory.   PHYSICAL EXAM:  GENERAL:  She is no acute distress.  She is lying flat  in bed.  VITAL SIGNS:  Respirations are unlabored.  She is currently afebrile.  Blood pressure is 127/49 with a heart rate of 60.  She is satting in the  high 90s on 2 liters.  HEENT:  Normal.  NECK:  Except for poor dentition neck is supple.  No obvious JVD.  Carotids are 2+ bilaterally without any bruits.  There is no  lymphadenopathy or thyromegaly.  CARDIAC:  PMI is nondisplaced.  She is regular with no murmurs, rubs or  gallops.  LUNGS:  Lungs are clear.  ABDOMEN:  Abdomen is mildly tender in the right upper quadrant.  There  is no rebound or guarding.  Good bowel sounds.  EXTREMITIES:  Warm with no cyanosis or clubbing.  There is trace to 1+  edema bilaterally.  No rash.  NEURO:  Alert and oriented.  Cranial nerves II-XII are grossly intact.  She moves all four extremities without difficulty.  Affect is pleasant.   LABS:  BNP is 530.  Troponin is 0.03, sodium is 131, potassium 3.4, BUN  of 25, creatinine of 1.5, AST and ALT are initially 66 and 50, now 28  and 30.  Her total bilirubin is less than 1.  Blood cultures show E.  coli.  White count is 12.9, hemoglobin is 8.9, platelets are 155.  EKG  shows atrial flutter at a rate of 71 with no significant ST-T wave  abnormalities.   ASSESSMENT:  1. Possible acute cholecystitis.  2. History of coronary artery disease status post coronary artery      bypass graft and status post two drug-eluting stents in March of      2009.  3. Normal LV function.  4. Paroxysmal atrial fibrillation and flutter previously thought not      to be a Coumadin candidate.  5. Poorly controlled diabetes.   PLAN AND DISCUSSION:  From a cardiac perspective she is at low to  moderate risk for perioperative complications and if she needs to go to  surgery she would not need any further testing.  Regarding her Plavix if  she needs surgery I think she is probably at  the low risk for stent  thrombosis and she is 9 months out so it would probably be okay to  discontinue her Plavix and proceed.  However, if she can be managed  medically I would obviously favor that option, I will leave that to the  surgical team.   Regarding her atrial fibrillation and flutter this is well rate  controlled.  Will continue beta-blocker.  She has been previously deemed  not to be a Coumadin candidate.   Please do not hesitate to call me with any questions.  We appreciate the  consult.      Bevelyn Buckles. Bensimhon, MD  Electronically Signed     DRB/MEDQ  D:  07/01/2008  T:  07/01/2008  Job:  [id number]   cc:   Gerrit Friends. Dietrich Pates, MD, [facility]  Annia Friendly. Loleta Chance, MD

## 2010-11-29 NOTE — Discharge Summary (Signed)
NAMEEMALINA, DUBREUIL NO.:  [id number]   MEDICAL RECORD NO.:  [id number]          PATIENT TYPE:  INP   LOCATION:  5128                         FACILITY:  [facility]   PHYSICIAN:  Anselm Pancoast. Weatherly, M.D.DATE OF BIRTH:  [birth year]   DATE OF ADMISSION:  09/02/2008  DATE OF DISCHARGE:  09/07/2008                               DISCHARGE SUMMARY   DISCHARGE DIAGNOSES:  1. Chronic cholecystitis with stones status post percutaneous drainage      for acute cholecystitis.  2. Atherosclerotic coronary artery disease.  3. Diabetes mellitus with a history of congestive heart failure.  4. Dehydration and azotemia.   HISTORY:  [name] is a 77 year old black female who was admitted to  [facility] numerous times over the past year with problems with her  heart and gallbladder.  She was first seen by our service in December  when she had acute cholecystitis.  She had had some abnormal liver  function studies and was awaiting cardiac clearance.  She was normally  on Plavix and the cardiac clearance was cleared, her Plavix had been  discontinued and then Dr. Ezzard Standing who was the actual physician on call or  the doctor of the week at the time of her being cleared recommended that  we do a percutaneous drain instead of urgent cholecystectomy and this  was done about second week in December.  She was discharged several days  later to a nursing home and was readmitted with problems with nausea,  vomiting and other symptoms in January and at that time it had not been  6 weeks and we recommended that we continue leaving the drain in for 6  weeks and then plan on doing a cholecystectomy.  She was cleared by Dr.  Dietrich Pates in preparation for this surgery.  He said it was okay to stop  the Plavix and arranged for admission for surgery on February 17.  When  she was admitted on the 17th, however, she was significantly dehydrated  with her BUN about 65 with creatinine approximately 3.5.   Her  electrolytes were not that bed and her glucose was about 290 when she  arrived here for her surgery from a nursing home.  I tried to contact  the [facility] physicians who have had her in about five times over the  last year, but they did not answer, but I did contact Dr. Riley Kill who  had been her cardiologist previously and we elected to hydrate her with  basically LR or saline and correct the dehydration, put her on her usual  sliding scale but decreased the Lantus to 10 from 15 that she takes in  the p.m. since she was having some low sugars intermittently and then  with 36 hours of hydration her creatinine had come down to approximately  2 and her BUN was in the 35s and then added her to the OR schedule and  proceeded on with a laparoscopic cholecystectomy and cholangiogram.  Dr.  Janee Morn assisted.  The cholangiogram was normal.  I did leave a Blake  drain in just because she had had  a previous drainage and there was  significant colon adherent to where the gallbladder had been.  She has  done nicely.  She went to step-down after surgery, has been mentally  clearer than usual and is now on the fourth postop day and is ready for  discharge.  We are going to remove the Miles drain this morning.  Her  electrolytes this morning are potassium is 5.7 and her sodium 132,  chloride 108 and CO2 of 20.  Her hematocrit is 29, white count 14,000  and glucose this morning was 83 and with the sliding insulin coverage  that she has been on, her sugars have ranged from about the 90s to 140  over the last couple of days.  She is increasing her food intake.  She  is receiving dietary supplements and her incision is healing  satisfactorily.  I am discontinuing the IVs at this time.  I am going to  restart the Lasix which she has been on that we had not given for the  first 3-4 days since I think the diuretics and also the drain that was  in her gallbladder that was allowing her to have a liter or  more bile  drainage contributed to the dehydration in the nursing home.  Her  medications otherwise will be the same as they were when she was  admitted.  She is on a baby aspirin a day.  She is on Celexa 20 mg a  day, Lasix 20 mg a day, Synthroid 50 mcg a day, Namenda 10 mg daily,  Zocor, Diprivan 10 mg extended-release, lisinopril can be restarted,  Cardizem she was taking two tablets a day and I have decreased it to  180.  She is on Lopressor b.i.d. 50 mg and Vicodin for pain.  Her  insulin she gets 10 units of Lantus in the evening and she is on a  NovoLog 2 units with lunch and supper with instructions of holding if  her sugar is less than 75 and that is what she is previously on.  Plus  the sliding NovoLog  that we had her on this admission is 150-200 two units, 201-250 three  units, 250-300 five units but she is just getting her small doses now  and most likely she had missed an insulin dosage at the nursing home  prior to coming in I think with a glucose was 290 when she arrived.  She  is ready for discharge when a bed is available.           ______________________________  Anselm Pancoast. Zachery Dakins, M.D.     WJW/MEDQ  D:  09/07/2008  T:  09/07/2008  Job:  [id number]

## 2010-11-29 NOTE — H&P (Signed)
NAMEABBYGALE, LAPID NO.:  [id number]   MEDICAL RECORD NO.:  [id number]          PATIENT TYPE:  INP   LOCATION:  2037                         FACILITY:  [facility]   PHYSICIAN:  Wendi Snipes, MD DATE OF BIRTH:  12/05/1926   DATE OF ADMISSION:  02/24/2008  DATE OF DISCHARGE:                              HISTORY & PHYSICAL   CHIEF COMPLAINT:  Chest pain.   HISTORY OF PRESENT ILLNESS:  Ms. Colan is a very pleasant 77 year old  African American female with part history of coronary disease.  She is  status post recent non ST elevation in March of 2009 with subsequent  catheterization in April of 2009 revealing three vessel native coronary  artery disease with patent grafts which received in the 90s, who  presents today from rehabilitation from a nursing home with a chief  complaint of chest pain.  The patient states that the chest pain began  at approximately 5 o'clock this afternoon and it was described as a  sharp, stabbing pain in the left chest wall that was associated with  nausea and some shortness of breath.  The pain lasted until she was able  to take nitroglycerin; however, the staff felt that her symptoms  warranted evaluation in the emergency department.  Otherwise the patient  describes a rather benign course in the nursing home after she was  recently admitted for chest pain and denies any exertional chest pain,  dyspnea on exertion, increased lower extremity edema, paroxysmal  nocturnal dyspnea.  In the ED her pain was resolved.  New lateral T-wave  inversions were noted on her 12-lead EKG.  She was seen as recently as  December 18, 2007 by Dr. Dietrich Pates in the cardiology clinic which found her to  be in stable condition with regards to her cardiac history.   PAST MEDICAL HISTORY:  1. Coronary disease, status post five vessel coronary artery bypass      graft in the 1990s followed by non ST elevation and MI in March      2009, which two vein grafts and  underwent PCI.  2. Hypertension.  3. IBDS.  4. Chronic renal insufficiency.  5. Atrial flutter, statuspost ablation.  6. Dyslipidemia.  7. Hypothyroidism.   MEDICATIONS ON ADMISSION:  1. Lasix 20 mg daily.  2. Aspirin 325 mg daily.  3. Plavix 75 mg daily.  4. Lisinopril 20 mg daily.  5. Detrol 4 mg daily.  6. Metoprolol 50 mg twice daily.  7. Namenda 10 mg daily, 5 mg in the evening.  8. Nitroglycerin  0.4 mg sublingually as directed.  9. Norvasc 5 mg daily.  10.Synthroid 50 mcg daily.  11.Zocor 40 mg at night.  12.Insulin 70/30 in a sliding scale as previously described.   ALLERGIES:  PENICILLIN.   SOCIAL HISTORY:  The patient lives in [facility]  [place].  She is widowed and she does not smoke cigarettes or use  alcohol.   FAMILY HISTORY:  Reviewed and noncontributory.   REVIEW OF SYSTEMS:  All 14 systems are reviewed and negative except as  mentioned  in the HPI.  The patient did state that approximately one  month ago she had chest pain in the middle of the night which required  sublingual nitroglycerin, otherwise she has been chest pain free with  activity.   PHYSICAL EXAMINATION:  VITAL SIGNS:  Blood pressure is 148/68,  respirations 20, her heart rate 71, saturating 90% on 2 L.  GENERAL:  Generally she is a pleasant, 77 year old African American  female appearing her stated age in no acute distress.  HEENT:  Moist mucous membranes.  Pupils equal, round, and reactive to  light and accommodation.  Anicteric sclerae.  NECK:  No jugular venous distention.  No thyromegaly.  CARDIOVASCULAR:  Regular rate and rhythm.  No murmurs, rubs, or gallops.  LUNGS:  Clear to auscultation bilaterally.  ABDOMEN:  Nontender.  Positive bowel sounds.  No masses.  EXTREMITIES:  No clubbing, cyanosis, or edema.  Pulses:  2+ pulses  throughout.  SKIN:  Warm, dry, and intact.  No rashes.  NEUROLOGIC:  Alert and oriented x3.  Cranial nerves II-XII grossly  intact.   No focal neurologic deficits.  PSYCHIATRIC:  Mood and psychiatric appropriate.   LABORATORY DATA:  Laboratory and radiology reviewed.  Initial troponin  is 0.03 with CK-MB of 4.2.  Creatinine is 1.59, putting urine in a 50.  INR is 1.  White blood cell count 7, hematocrit is 35.7, platelets 231.   Chest x-ray shows no acute cardiopulmonary abnormality.  EKG shows  normal sinus rhythm with normal axis and new T-wave inversions in the  lateral distribution.  It appears to be repolarization abnormality with  possible left ventricular hypertrophy.   ASSESSMENT/PLAN:  This is a 77 year old female with severe coronary  disease, status post coronary bypass graft and subsequent stenting of  vein grafts with a recent non ST elevation MI in March of 2009, who  presents with atypical chest pain and no objective evidence of ischemia  and new changes on her EKG.  1. Atypical chest pain.  Her EKG changes are overall nonspecific,      however, will admit her to rule out MI.  This does not appear to be      acute coronary syndrome; however, the patient is high risk.  She is      chest pain free right now.  Will continue her current antiplatelet      and medical therapy for severe coronary disease.  If she rules out      I believe that she can probably be discharged without any further      functional study or cardiac catheterization. A stress test would      likely be positive and she is currently not having chronic anginal      symptoms at this point.  2. Acute on chronic renal insufficiency.  By exam she may be a little      dry.  Will hold her Lasix and consider bolusing her with a small      amount of fluid to see if her creatinine improves with hydration.      Will check a BNP.     Wendi Snipes, MD  Electronically Signed    BHH/MEDQ  D:  02/24/2008  T:  02/25/2008  Job:  [id number]

## 2010-11-29 NOTE — Discharge Summary (Signed)
NAMESHANTERICA, [name]                ACCOUNT NO.:  [id number]   MEDICAL RECORD NO.:  [id number]          PATIENT TYPE:  INP   LOCATION:  6532                         FACILITY:  [facility]   PHYSICIAN:  [name], MD, FACCDATE OF BIRTH:  1927-04-28   DATE OF ADMISSION:  03/17/2008  DATE OF DISCHARGE:  03/18/2008                               DISCHARGE SUMMARY   PROCEDURES:  None.   PRIMARY FINAL DISCHARGE DIAGNOSIS:  Chest pain, medical therapy for  coronary artery disease recommended.   SECONDARY DIAGNOSES:  1. Status post five-vessel bypass surgery in the 1990s.  2. Status post non-ST segment elevation myocardial infarction in March      of 2009 with percutaneous intervention times two to the saphenous      vein graft, patent stents and grafts by catheterization in April of      2009.  3. Hypertension.  4. History of atrial flutter with ablation in 2004.  5. Dyslipidemia.  6. Irritable bowel syndrome.  7. Chronic renal insufficiency stage III, with a BUN of 37, creatinine      1.19, and glomerular filtration rate of 53 this admission.  8. Hypothyroidism.  9. Iron deficiency anemia with a hemoglobin of 11.9 and hematocrit of      35.6 this admission.  10.Allergy or intolerance to penicillin.   TIME AT DISCHARGE:  28 minutes.   HOSPITAL COURSE:  [name] is a 77 year old female with a history of  coronary artery disease.  She had chest pain on the day of admission and  came to the hospital, where she was admitted for further evaluation.   By the time she was admitted, her pain had resolved.  She was held  overnight and cardiac enzymes were cycled and although her CK-MB was  slightly elevated at 144/4.3 with an index of 3.0, troponins were  negative.  On March 18, 2008, Dr. Eden Emms evaluated [name].  He  felt that she could be safely discharged home, as her symptoms had  resolved and troponins were all negative.  An outpatient stress test and  follow-up will be  arranged.  [name] is considered stable for  discharge on March 18, 2008.   DISCHARGE INSTRUCTIONS:  Her activity level is to be increased  gradually.  She is to stick to a low-sodium, heart healthy, diabetic  diet.  She is to follow up with Dr. Dietrich Pates and our office will call.  She is to get a stress test and our office will call.  She is to follow  up with Dr. Chilton Si and Dr. Chestine Spore as needed.   DISCHARGE MEDICATIONS:  1. Lasix 20 mg daily.  2. Aspirin 325 mg daily.  3. Lisinopril 20 mg a day.  4. Detrol LA 4 mg a day.  5. Namenda 10 mg a.m. and 5 mg p.m.  6. Norvasc 5 mg daily.  7. Synthroid 50 mcg daily.  8. Zocor 40 mg q.p.m.  9. Lantus 25 units subcu in a.m.  10.Artificial Tears four times a day.  11.NovoLog sliding scale as prior to admission.  12.Nitroglycerin sublingual p.r.n.  13.Metoprolol 50 mg daily.      Theodore Demark, PA-C      [name], MD, [facility]  Electronically Signed    RB/MEDQ  D:  03/18/2008  T:  03/18/2008  Job:  [id number]   cc:   Margaretmary Bayley, M.D.  Lenon Curt Chilton Si, M.D.

## 2010-11-29 NOTE — Cardiovascular Report (Signed)
NAMEYARELIS, AMBROSINO NO.:  [id number]   MEDICAL RECORD NO.:  [id number]          PATIENT TYPE:  INP   LOCATION:  4743                         FACILITY:  [facility]   PHYSICIAN:  Veverly Fells. Excell Seltzer, MD  DATE OF BIRTH:  31-Jul-1926   DATE OF PROCEDURE:  11/04/2007  DATE OF DISCHARGE:                            CARDIAC CATHETERIZATION   PROCEDURE:  Left heart catheterization, selective coronary angiography,  saphenous vein graft angiography, and LIMA angiography.   INDICATIONS:  Ms. Dolbow is a 77 year old woman who presented with  chest pain and altered mental status.  She was hypoglycemic on  presentation.  Her mental status has improved, but she complained of  chest discomfort over the first day of her hospital stay.  She is ruled  out for myocardial infarction.  She has a history of significant CAD  with prior bypass surgery approximately 10 years ago and recent stenting  of 2 of her bypass grafts.  In that setting, she was referred for re-  look cardiac catheterization to rule out progression of her CAD or  problem with either of her recently placed stents.   Risks and indications of procedure were reviewed with the patient.  Informed consent was obtained.  I spoke with the patient's daughter,  Loyal Jacobson, prior to procedure and reviewed the risks and  indications with her as well.  The right groin was prepped, draped, and  anesthetized with 1% lidocaine using modified Seldinger technique.  A 6-  French sheath was placed in the right femoral artery.  Standard 6-French  Judkins catheters were used for coronary angiography.  The JR-4 catheter  was used for saphenous vein graft and LIMA angiography.  An angled  pigtail catheter was used to record left ventricular pressure.  A  ventriculogram was deferred due to renal insufficiency.  A pullback  across the aortic valve was done.  All catheter exchanges were performed  over the guidewire.  The patient tolerated the  procedure well and had no  immediate complications.   FINDINGS:  Aortic pressure 167/55 with a mean of 99 and left ventricular  pressure 163/90.   Coronary angiography, left mainstem is heavily calcified.  There is no  significant stenosis.  It bifurcates into the LAD and left circumflex.   The LAD is heavily calcified.  There is severe diffuse disease  throughout the proximal vessel of 80%-90%.  The midportion of the vessel  has a 90% stenosis.  There is a patent LIMA to LAD that is anastomosed  to the midportion of the vessel and is widely patent.  The mid and  distal LAD fill predominantly from LIMA flow.   Left circumflex:  Left circumflex is heavily calcified.  It is occluded  just beyond the ostium of the vessel.   Right coronary artery:  The right coronary artery is heavily calcified.  It is occluded at the proximal and midportion of the vessel.  The ostium  has an 80%-90% stenosis.   Saphenous vein graft angiography:  Saphenous vein graft to the sequence  to the first and second OM branch is widely  patent.  There is a patent  stent in the sequence portion of the graft with no re-stenosis.  The  native target vessels are also widely patent.   Saphenous vein graft to first diagonal is widely patent.  There is a  stent in the distal body of the graft that is widely patent.  The  diagonal branch has no significant stenoses.   Saphenous vein graft to the distal right coronary artery is widely  patent.  There are diffuse 30% luminal irregularities through the  proximal portion of the graft, this is unchanged from the previous.  The  distal vessel was patent with runoff into the PDA and posterior AV  segment.  The right coronary artery fills retrograde from the graft up  to where the occlusion is present in the midportion of the vessel.   Patent.  The mid and distal LAD fill predominantly from LIMA flow.   ASSESSMENT:  1. Severe 3-vessel native coronary artery disease.  2.  Status post coronary bypass surgery with 5 patent grafts.  3. Patent stents in the saphenous vein graft to obtuse marginal and      saphenous vein graft to first diagonal.  4. Normal left ventricular end-diastolic pressure.   PLAN:  Ms. Osmon should do well with ongoing medical therapy.  Her  anatomy is stable from her recent catheterization.      Veverly Fells. Excell Seltzer, MD  Electronically Signed     MDC/MEDQ  D:  11/04/2007  T:  11/05/2007  Job:  [id number]

## 2010-11-29 NOTE — Discharge Summary (Signed)
NAMELAYAAN, MOTT NO.:  [id number]   MEDICAL RECORD NO.:  [id number]          PATIENT TYPE:  INP   LOCATION:  4732                         FACILITY:  [facility]   PHYSICIAN:  Isidor Holts, M.D.  DATE OF BIRTH:  June 07, 1927   DATE OF ADMISSION:  05/20/2008  DATE OF DISCHARGE:  05/28/2008                               DISCHARGE SUMMARY   ADDENDUM:   PRIMARY MEDICAL DOCTOR:  Unassigned.   DISCHARGE DIAGNOSES:  For discharge diagnoses, refer to interim  discharge summary dictated May 26, 2008 by Dr. Christella Noa.   For procedures, consultations, admission history and detailed clinical  course, refer to above mentioned discharge summary.   DISCHARGE MEDICATIONS:  1. Protonix 40 mg p.o. daily.  2. Detrol LA 4 mg p.o. daily.  3. Lopressor 50 mg p.o. b.i.d.  4. Namenda 10 mg p.o. q.a.m. and 5 mg p.o. q.p.m.  5. Plavix 75 mg p.o. daily.  6. Levothyroxine 50 mcg p.o. daily.  7. Zocor 40 mg p.o. q.h.s..  8. Aspirin 81 mg p.o. daily.  9. Norvasc 10 mg p.o. daily.  10.Lasix 20 mg p.o. daily.  11.Lisinopril 20 mg p.o. daily.  12.Lantus insulin 5 units subcutaneously q.12 h.  13.Sliding-scale insulin coverage with NovoLog as follows:  CBG 70-100      no insulin; CBG 101-150 2 units; CBG 151-200 3 units; CBG 201-250 5      units; CBG 251-300 7 units; CBG 301-350 9 units; CBG 351-400 11      units.   DISPOSITION:  The patient as of May 27, 2008 to May 28, 2008,  remained clinically stable.  She did have a hypoglycemic episode on  May 26, 2008, necessitating reducing Lantus dosage to 5 units  subcutaneously q. 12 hourly.  Paroxysmal atrial fibrillation remained  controlled.  Acute renal failure has of course, resolved.  As of  May 28, 2008, BUN was 12 with a creatinine of 1.04.  Likewise,  fluid overload has resolved.  The patient as of May 28, 2008, was  euvolemic.  Hemoglobin remaind stable/reasonable at 10.0 with a  hematocrit of 29.4 on  May 27, 2008.  Blood pressure has been  brought under adequate control.  As of May 28, 2008, there were no  further issues and there were no further problems referable to  cardiovascular system. The patient was therefore deemed stable for  discharge, and has therefore been discharged to a skilled nursing  facility.   DIET:  Heart-healthy/carbohydrate modified.   ACTIVITY:  As tolerated, otherwise per PT/OT.   FOLLOW-UP INSTRUCTIONS:  The patient is to follow up with her primary  cardiologist, Dr. Dietrich Pates, within 1 week of discharge.      Isidor Holts, M.D.  Electronically Signed     CO/MEDQ  D:  05/28/2008  T:  05/28/2008  Job:  [id number]   cc:   Gerrit Friends. Dietrich Pates, MD, [facility]

## 2010-11-29 NOTE — Op Note (Signed)
NAMEMEIA, EMLEY                ACCOUNT NO.:  [id number]   MEDICAL RECORD NO.:  [id number]          PATIENT TYPE:  AMB   LOCATION:  SDC                           FACILITY:  [facility]   PHYSICIAN:  Dois Davenport A. Rivard, M.D. DATE OF BIRTH:  1926-09-04   DATE OF PROCEDURE:  06/28/2007  DATE OF DISCHARGE:                               OPERATIVE REPORT   PREOPERATIVE DIAGNOSIS:  Postmenopausal bleeding with cervical stenosis.   POSTOPERATIVE DIAGNOSIS:  Postmenopausal bleeding with cervical stenosis  with endometrial polyp.   ANESTHESIA:  IV sedation with paracervical block, Dr. Jean Rosenthal, Dr.  Estanislado Pandy.   PROCEDURE:  Hysteroscopy, fractioned D and C and resection of  endometrial polyp.   SURGEON:  Crist Fat. Rivard, M.D.   ASSISTANT:  No assistant.   ESTIMATED BLOOD LOSS:  Minimal.   PROCEDURE IN DETAIL:  After being informed of the planned procedure with  possible complications including bleeding, infection, injury to uterus,  informed consent is obtained.  The patient is taken to OR #3 and given  IV sedation without complication.  She is placed in the lithotomy  position, prepped and draped in a sterile fashion and her bladder is  emptied with an in-and-out Foley catheter.  Pelvic exam reveals a  retroverted uterus normal in size and adnexa that are not felt.  A  weighted speculum is inserted.  Anterior lip of the cervix is grasped  with a tenaculum forceps and we proceed with a paracervical block using  8 mL of Novocain 1% in the usual fashion.  Cervical os is found with an  os finder which allows easy entry and then subsequently we were able to  dilate the cervix using Hegar dilator until #31.  Before proceeding with  dilatation we did proceed with endocervical curettage.  The uterus is  sounded at 7 cm and we can then insert a diagnostic hysteroscope very  easily with a perfusion of sorbitol at a maximum pressure of 80 mmHg.  This allows for easy visualization of the endometrial  cavity which  overall appears atrophic.  We do see intrauterine synechia and we also  see a left lower uterine segment polyp measuring approximately 0.8 cm.  We removed the hysteroscope and proceed with a sharp curettage of the  endometrial cavity which obviously does not return a lot of tissue.  A  second look with the hysteroscope reveals that the polyp is still  present and decision is made to move to operative scope and with a  single loop resect that polyp.   Instruments are then removed.  Instruments and sponge count is complete  x2.  Estimated blood loss is minimal.  Fluid deficit is 75 mL.  The  procedure is very well tolerated by the patient who is taken to recovery  room in a well and stable condition.   SPECIMEN:  Endocervical curettage, endometrial curettage and endometrial  polyp all sent separately to pathology.      Crist Fat Rivard, M.D.  Electronically Signed     SAR/MEDQ  D:  06/28/2007  T:  06/28/2007  Job:  [id number]

## 2010-11-29 NOTE — H&P (Signed)
[name], [name]:  [id number]   MEDICAL RECORD NO.:  [id number]          PATIENT TYPE:  EMS   LOCATION:  MAJO                         FACILITY:  [facility]   PHYSICIAN:  Michaelyn Barter, M.D. DATE OF BIRTH:  June 06, 1927   DATE OF ADMISSION:  08/19/2007  DATE OF DISCHARGE:                              HISTORY & PHYSICAL   PRIMARY CARE PHYSICIAN:  Annia Friendly. Hill, MD   CHIEF COMPLAINT:  Progressive weakness.   HISTORY OF PRESENT ILLNESS:  [name] is a 77 year old female with a  past medical history of insulin-dependent diabetes mellitus, as well as  CHF and CAD status post CABG.  She is very vague with regard to what  actually brought her here and she is also a very poor historian, but she  indicated that over the past 4-5 days she has been feeling progressively  more weak and dehydrated.  She also indicates that her appetite has  declined significantly over the past several days.  She states that  today in particular she began to feel much more thirsty than usual.  There has been no nausea or vomiting.  She states that she has been  compliant with her insulin.  She denies having any pain.  No changes in  her bowel habits.  No dysuria.  No new odors to her urine.  She went to  see her primary care doctor for evaluation and was subsequently referred  to the hospital for admission.   MEDICATIONS:  1. Lantus insulin 35 units in the morning.  2. Lovastatin.  3. Namenda.  4. Lisinopril.  5. Metoprolol tartrate.  6. Furosemide.  7. Potassium chloride.  8. Isosorbide mononitrate.   The patient does not recall the dosages of any of her current  medications.   ALLERGIES:  PENICILLIN, REACTION IS UNKNOWN.   SOCIAL HISTORY:  Cigarettes.  The patient denies alcohol occasionally.   FAMILY HISTORY:  The patient cannot remember.   REVIEW OF SYSTEMS:  As per HPI, otherwise all other systems are  negative.   PHYSICAL EXAMINATION:  GENERAL:  The patient is  awake.  She is  cooperative.  She is in no obvious respiratory distress.  VITALS:  Temperature 98.7.  Blood pressure 107/64.  Heart rate 76.  Respiration 16.  O2 sat is 99%.  HEENT:  Normocephalic, atraumatic.  Anicteric.  Extraocular movements  are intact.  Pupils have decreased activity to light bilaterally.  Oral  mucosa is pink, dry.  No thrush or exudate.  NECK:  Supple  No lymphadenopathy, no thyromegaly.  CARDIAC:  S1 and S2 present.  Regular rate and rhythm.  RESPIRATORY:  No crackles or wheezes.  ABDOMEN:  Flat, soft, nontender, nondistended.  Positive bowel sounds x4  quadrants.  No masses are palpated.  EXTREMITIES:  No leg edema.  NEUROLOGIC:  The patient is alert and oriented x3.  Cranial nerves 2-12  are intact.  Although there is a decreased activity with regards to 2  and 3 to actual pupil response to light.  MUSCULOSKELETAL:  5/5 upper and lower extremity strength.   LABS:  Urinalysis, WBCs are 0-2.  Many yeast are present.  Nitrite is  negative.  Leukocyte is trace.  Sodium 132, potassium 5.1, chloride 96,  CO2 27, glucose 481, BUN 46, creatinine 1.68.  Calcium 9.6, pH is 7.489,  pCO2 is 34.1, bicarbonate 26.0.  White blood cell count 6.9, hemoglobin  12.7, hematocrit 38.0, platelet count 173.   ASSESSMENT AND PLAN:  1. Hyperglycemia.  I suspect that this is a nonketotic process.  The      patient does not have an anion gap at this particular time and her      arterial blood gas has a normal pH.  The precipitating factor for      the patient's current hyperglycemic state is questionable, given      the fact that she states that she has been compliant with her      insulin regimen.  Glucommander has been initiated in the emergency      department.  Will continue the glucommander and gradually transfer      the patient off of the glucommander onto subcutaneous insulin.      Will check a hemoglobin A1c to assess the patient's control of her      sugars over the past 3  months.  2. Dehydration.  Will gently hydrate the patient for now, keeping in      mind that she does have a history of congestive heart failure.      Will also consider holding the patient's Lasix for now.  3. History of congestive heart failure.  This currently appears to be      compensated.  Will maintain strict ins and outs and check her BNP      in the am..  4. Renal insufficiency.  Again, I suspect that dehydration may have      contributed.  Will gently hydrate and monitor the patient's BUN and      creatinine closely.  5. Gastrointestinal prophylaxis.  Will provide Protonix.  6. Deep venous thrombosis prophylaxis provide Lovenox.      Michaelyn Barter, M.D.  Electronically Signed     OR/MEDQ  D:  08/19/2007  T:  08/19/2007  Job:  [id number]   cc:   Annia Friendly. Loleta Chance, MD

## 2010-11-29 NOTE — Discharge Summary (Signed)
[name], [name]:  [id number]   MEDICAL RECORD NO.:  [id number]          PATIENT TYPE:  INP   LOCATION:  4743                         FACILITY:  [facility]   PHYSICIAN:  [name], NPDATE OF BIRTH:  1926-08-14   DATE OF ADMISSION:  11/03/2007  DATE OF DISCHARGE:  11/08/2007                               DISCHARGE SUMMARY   PRIMARY CARDIOLOGIST:  [name].   PRIMARY CARE PHYSICIAN:  Not listed.   PROCEDURES PERFORMED DURING HOSPITALIZATION:  Cardiac catheterization  completed on November 04, 2007, by Dr. Tonny Bollman revealing severe  three-vessel CAD, patent bypass grafts, patent stents, normal LV EDP.  Continued medical treatment.   FINAL DISCHARGE DIAGNOSES:  1. Chest pain.  2. CAD.      a.     Status post coronary artery bypass grafting, unknown date,       approximately 1990s.      b.     Non-ST elevated MI March 2009.      c.     Status post cardiac catheterization revealing severe three-       vessel coronary artery disease with patent stents and grafts.  3. Hypertensive urgency, admission blood pressure 201/104.  4. Diabetes.  5. Mental status changes.   [name] COURSE:  This is a 77 year old African American female with a  long history to include diabetes, CAD, non-ST elevated MI and  hypertension who complained of left eye pain and headache on day of  admission.  The patient had an episode of a convulsion and was taken  [name] emergency room where she was found to be hypoglycemic.  The  patient was treated with D50 and headache resolved.  She was still  mildly confused.  She was discharged [name] from [name] and then an  hour later complained of left substernal chest pressure and mental  status changes.  The patient was brought to [name] ED.  We  were requested to see the patient as a result of the chest discomfort  and hypertension.  On arrival, the patient's blood pressure was elevated  at 201/104.   The patient was started on a nitroglycerin drip and given  amlodipine.  She was seen by [name], fellow for [name].  The  patient was admitted to rule out myocardial infarction with a history of  CAD for glycemic control and blood pressure control.   The patient was followed throughout hospitalization by [name] and  cardiac catheterization was ordered for November 04, 2007.  The patient's  troponins were negative, but with recurrent chest discomfort it was felt  that the patient should be studied because of her severe history of CAD.   Cardiac catheterization was completed by [name].  Please see Dr.  Earmon [name] thorough dictation note for more details  It did show severe  three-vessel CAD.  The grafts were patent and her stents were patent.  LV EDP was found to be normal.   The patient's medications were adjusted and endocrinology was consulted  as well for diabetes control.  The patient was  placed on metoprolol 50  mg twice a day along with her [name] medications of Norvasc.  Norvasc was  increased to 10 mg a day.  The patient was continued on lisinopril.  Her  Imdur was discontinued, and her insulin was also managed by the diabetes  program in [name] with changes in her insulin dosage.   The patient was evaluated for skilled nursing facility placement  secondary to her unable to take medications and control diabetes as an  outpatient.  The patient was seen by [name] and also her  daughter, [name], was involved in her decision making concerning  placement.  On day of discharge, social worker reported that the family  has agreed to have her placed at [name] with the medical  staff to continue with PT/OT and care management.  She will need to have  diabetes management and monitoring of blood pressure regularly.  At this  time, the patient is agreeable to have this completed and family support  is there.   The patient recovered from  catheterization without any events of  bleeding, hematoma, signs of infection or drop in hemoglobin or  hematocrit.  The patient remained stable throughout hospitalization with  medication adjustments as discussed above.  The patient's blood pressure  and heart rate remained stable.  On day of discharge, the patient's  blood pressure was 154/68, heart rate 66, respirations 20, O2 sat 99% on  room air.  Hemoglobin was 10.9, platelets 168, potassium 5.2, BUN 20,  creatinine 1.1.   DISCHARGE MEDICATIONS:  1. Insulin 70/30 15 units each p.m. after supper with sliding scale      blood glucose to be as follows:  Blood glucose 100-175, 15 units of      70/30; blood glucose 176-250, 20 units of 70/30; blood glucose of      251-325, 28 units of 70/30; blood glucose of 326-400, 35 units of      70/30.  2. Plavix 75 mg daily.  3. Levothyroxine 25 mg daily.  4. Norvasc 10 mg daily (increased from 5 mg daily).  5. Memantine 10 mg in the a.m., 5 mg in the p.m.  6. Detrol 4 mg daily.  7. Aspirin 325 daily.  8. Lopressor 50 mg twice a day (new medication).  9. Lisinopril 20 mg daily.  10.Nitroglycerin 0.4 mg sublingual p.r.n. pain.  11.The patient has been advised not to continue Imdur as she was      taking at [name].   ALLERGIES:  Penicillin.   FOLLOW-UP PLANS AND APPOINTMENT:  1. The patient will follow up with [name] on Nov 17, 2007,      for continued diabetes management.  2. The patient is to follow up with [name] in  [name]      office on Tuesday May 26 at 10:15 a.m.  3. The patient has been given post cardiac catheterization      instructions with particular emphasis on the right groin site for      evidence of bleeding, signs of infection or hematoma.  4. The patient will be discharged [name] to the care of [name]      skilled nursing facility where she will undergo continued      management of diabetes to include fingerstick blood sugars with      sliding  scale insulin as listed above and blood pressure      monitoring.      Jaclyn Mare. Lyman Bishop, NP  KML/MEDQ  D:  11/08/2007  T:  11/08/2007  Job:  [id number]   cc:   Margaretmary [name], M.D.  [facility] [name]

## 2010-11-29 NOTE — Discharge Summary (Signed)
[name], [name]:  [id number]   MEDICAL RECORD NO.:  [id number]          PATIENT TYPE:  INP   LOCATION:  2108                         FACILITY:  [facility]   PHYSICIAN:  Nelda Bucks, MD DATE OF BIRTH:  [birth year]   DATE OF ADMISSION:  03/09/2009  DATE OF DISCHARGE:                               DISCHARGE SUMMARY   REASON FOR ADMISSION:  Altered mental status.   DISCHARGE DIAGNOSES:  1. Systemic inflammatory response syndrome with resultant severe      sepsis in the setting of lower urinary tract infection (no      organisms specified).  2. Acute renal failure superimposed on chronic renal insufficiency      secondary to problem #1, complicated by acute tubular necrosis in      the setting of ACE inhibitors.  3. Hyperkalemia secondary to problem #2.  4. Bradycardia secondary to problem #3.  5. Delirium superimposed on underlying dementia.  6. Elevated liver function tests, most likely shock liver.  7. Hypertension.  8. History of congestive heart failure.  9. Hypothyroidism.  10.Diabetes type 2.   LABORATORY DATA:  Date March 11, 2009, sodium 135, potassium 4.3,  chloride 99, bicarbonate 26, glucose 302, BUN 27, creatinine 1.6,  calcium 8.6, magnesium 1.9, phosphate 2.8, total protein 5.7, albumin  2.7, AST 170, ALT 160, alk phosphate 182.  Of note, AST on March 10, 2009 was 340 and ALT on March 10, 2009, was 194 with an alk phosphate  of 186.  Date March 11, 2009, hemoglobin 9.1, hematocrit 27.7, white  blood cell count 8.2, INR is 1.3 with a PTT of 28.   CURRENT MICROBIOLOGY DATA:  Urine culture on March 09, 2009, was no  growth.  Blood cultures March 09, 2009, both pending, but no organisms  to date.  Of note, she did have a urinary culture in December 2009,  which demonstrated Escherichia coli which was Cipro resistant.   BRIEF HISTORY:  This is an 77 year old female patient who resides at a  nursing home with a known history of  congestive heart failure, atrial  fibrillation, hypertension, and dementia, reported to the emergency room  on March 09, 2009, in the setting of altered sensorium.  During a  diagnostic evaluation was found to have severe hyperkalemia, and  resultant bradycardia in addition to acute renal failure.  In the  setting of her multiple metabolic derangements, the pulmonary critical  care team was asked to admit.   HOSPITAL COURSE BY DISCHARGE DIAGNOSES:  1. Systemic inflammatory response syndrome with resultant severe      sepsis in the setting of lower urinary tract infection (no organism      specified).  The patient was admitted to the intensive care.      Treatment consisted of central access for goal-directed therapy, IV      fluid resuscitation, and empiric antibiotics.  She was treated      empirically with Cipro on March 09, 2009.  This was changed to      ceftriaxone on March 10, 2009, with a history of Cipro,  resistant      E-coli.  Urinary cultures were negative; however, she did have      white blood cells on discharge in urine.  Therefore, she will be      treated empirically for urinary tract infection.  Given her allergy      to penicillin, she was given ceftriaxone, she has tolerated this,      she therefore will be sent back to the nursing home with a 7-day      plan of Ceftin.  2. Acute renal failure superimposed on chronic renal insufficiency      with a baseline serum creatinine of 1.17.  Of note, her maximum      serum creatinine was in excess of 2.8, this was felt to be      secondary to urinary tract infection with resultant severe sepsis,      septic shock, volume depletion, and acute tubular necrosis in the      setting of ACE inhibitor administration.  The ACE inhibitor has      been held, she has been volume resuscitated, and her serum      creatinine continues to improve.  Upon time of discharge, her serum      creatinine is 1.6.  Nephrology consultation was  obtained.  Dr.      Marina Gravel had seen in consultation in the intensive care.      Recommendations are to hold ACE inhibitor most likely indefinitely,      and should she need blood pressure control continue calcium channel      blocker, and beta blockade as previously prescribed.  She will      require outpatient chemistry for further monitoring of resolution      of acute renal failure.  3. Hyperkalemia secondary to problem #1.  This was treated      symptomatically with initially D50, calcium chloride, albuterol, IV      insulin, and IV bicarbonate.  Additionally, she required Kayexalate      administration.  This slowly resolved with volume resuscitation and      improvement in renal perfusion.  It additionally improved after      cessation of ACE inhibitor administration.  Upon time of discharge      her potassium has normalized.  Recommendations are for follow-up      blood chemistry, and would hold ACE inhibitor.  4. Bradycardia secondary to problem #3.  This is now resolved.  5. Delirium superimposed on underlying dementia.  This is now resolved      and it was secondary to problem #1.  Plan for this is to continue      her typical medications for her dementia.  6. Shock liver.  This is slowly resolving with LFTs as mentioned      above.  She will need follow-up chemistry and liver function tests      in the outpatient setting.  7. Hypertension.  At this point, we will resume Cardizem as previously      dosed, given her recent bradycardia would hold off initially on      Lopressor, but to consider resuming Lopressor in the outpatient      setting should heart rate tolerate.  8. History of congestive heart failure.  For this, she will resume      Cardizem, can consider resuming beta blockade if heart rate will      tolerate, and would continue aspirin.  9. History of atrial fibrillation.  For this, we will continue rate      control with Cardizem, again beta blockade as  mentioned above, and      finally continue daily aspirin.  10.Hypothyroidism.  Continue Synthroid administration.  11.Diabetes type 2.  Continue Lantus scheduled.   DIET:  Regular.   DISCHARGE MEDICATIONS:  1. Aspirin 81 mg daily.  2. Ditropan XL 10 mg daily.  3. Cardizem 180 mg p.o. daily.  4. Celexa 20 mg p.o. daily.  5. Synthroid 50 mcg 1 p.o. daily.  6. Plavix 75 mg p.o. daily.  7. Prilosec 20 mg p.o. daily.  8. Namenda 10 mg daily.  9. Ferrous sulfate 325 mg 1 tablet daily.  10.Remeron 45 mg daily.  Discontinue the Zocor.  11.Lantus insulin 5 units subcu q.12 h.  12.NovoLog insulin a.c., for glucose 121-150 equals 1 unit, 151-200      equals 2 units 201-250 equals 3 units 251/300 equals 5 units, 301-      350 equals 7 units, 351-400 equals 9 units.  13.Ceftin 250 mg 1 tablet p.o. b.i.d. x7 days then discontinue.   DISCONTINUE THE FOLLOWING MEDICATIONS:  Do not to resume lisinopril,  hold prior Lasix, hold Lopressor (see above discussion), and hold Zocor  until LFTs have normalized.   DISPOSITION:  [name] has met maximum benefit from inpatient stay.  She is now medically cleared for transfer back to skilled nursing  facility.  She will need laboratory data as discussed above.  This will  include a follow-up of complete blood count, blood chemistry, and liver  function testing.  Again as noted above, her Ace inhibitor should be  discontinued indefinitely, she should be reevaluated for resuming  Lopressor and her Zocor.  At the time being would continue to hold  diuretic therapy, and reevaluate in the outpatient setting based on  volume status, and renal function testing.      Zenia Resides, NP      Nelda Bucks, MD  Electronically Signed    PB/MEDQ  D:  03/11/2009  T:  03/11/2009  Job:  [id number]

## 2010-11-29 NOTE — Op Note (Signed)
NAMECLEMMA, [name] NO.:  [id number]   MEDICAL RECORD NO.:  [id number]          PATIENT TYPE:  OIB   LOCATION:  3301                         FACILITY:  [facility]   PHYSICIAN:  Anselm Pancoast. Weatherly, M.D.DATE OF BIRTH:  1927/03/05   DATE OF PROCEDURE:  09/03/2008  DATE OF DISCHARGE:                               OPERATIVE REPORT   PREOPERATIVE DIAGNOSES:  1. Subacute cholecystitis, status post percutaneous drainage.  2. Diabetes mellitus.  3. History of coronary artery disease.  4. Hypertension.   OPERATION:  Laparoscopic cholecystectomy with cholangiogram.   SURGEON:  Anselm Pancoast. Zachery Dakins, MD   ASSISTANT:  Gabrielle Dare. Janee Morn, MD   ANESTHESIA:  General anesthesia.   HISTORY:  [name] is an 77 year old black female, today is  actually her birthday, who was hospitalized numerous times over the past  year with episodes of cardiac, abdominal pain, abnormal enzymes, and had  acute cholecystitis in December when I was doctor that week.  She was on  Plavix and was trying to get cardiac clearance.  We withheld the Plavix  and finally they cleared her, but since she had had such an inflamed  gallbladder for over a week.  Dr. Ezzard Standing had a percutaneous drainage  done.  She has been back in the hospital another time with dehydration  and doing poorly in January, but it had not been 6 weeks.  I saw her in  the office.  She has been cleared by the cardiologist for surgery and  was admitted on Wednesday with plans for cholecystectomy.  However, when  she got here her BUN was approximately 7, potassium was 6.4, glucose was  270, and we started her on sliding insulin, held her Lasix, and cut her  numerous medicines.  She will want to talk with Dr. Riley Kill who had  managed her previously and then over the next 36 hours, we rehydrated  her.  She is much better.  Her sugars have been in the actually from 75  to about 125 range, and I think it is safe to go ahead and  proceed with  surgery.  The family had taken off yesterday and also today and they  wanted [place] to proceed if anyway possible tonight.  It was numerous one  reason after another that we could not get to the OR because of other  activities, but the family wished that we proceed.  So at 11 o'clock, we  finally got into the operating room.  We gave her 400 mg of Cipro,  induced general anesthesia, and endotracheal tube.  Hemodynamically, she  was stable.  I did put a Foley catheter in her and then prepped the  abdomen with Betadine scrub and solution and draped her in a sterile  manner.  The drain, percutaneous in the right upper quadrant, and we  made a little incision below her umbilicus.  Then, we entered into the  peritoneal cavity, a pursestring and Hasson cannula introduced.  We then  could see into the peritoneal cavity and the drain was not actually  through the liver but actually just  through the gallbladder.  It was a  kind of adherent to the colon, but I put the upper 10-mm trocar in under  direct vision and then Dr. Janee Morn put a lateral 5-mm trocar and then  we actually went and clipped the suture, holding the drain in place, and  withdrew the drain, and then could grasp the gallbladder really right  where it goes up to the drain and then using the sharp scissors, cut the  little peritoneal bridge going down to the gallbladder.  Next, the  second 5-mm trocar was placed and then we could carefully dissect the  colon that was adherent to the large gallbladder, fortunately it is not  really acutely inflamed now, and after we decided to get into the area  that separated fairly easily.  The proximal portion of the gallbladder  was free.  We could open the peritoneum gradually , kind of encompass  the cystic duct and then put a clip at the junction of the cystic duct,  gallbladder, opened the area proximally, put in a catheter, and did a  cholangiogram with good prompt filling of the  extrahepatic biliary  system into the duodenum.  The intrahepatic radicals, we removed the  catheter, put 3 clips on the cystic duct proximally and then divided it.  The cystic artery was next freed up, doubly clipped, proximally and  singly distally and divided and then the gallbladder was freed from its  bed using predominately the electrocautery with a hook and then let it  go into the spatular.  The most distal portion of the gallbladder was  found inherent in the liver and had a kind of carve it out and then we  cauterized the liver bed.  Since she has had percutaneous drainage and  __________ chronically inflamed, I went ahead and put an 18-blade drain  in the lateral 5-mm port and placed it up on to the edge of the liver.  We had inspected the good hemostasis and the clips were in good  position.  Next, the camera was switched to the upper 10-mm port.  The  bag containing the gallbladder was then withdrawn through the umbilicus  and an additional figure-of-eight with 0 Vicryl was placed in the fascia  and the umbilicus and then this was anesthetized.  The drain had been  sutured to the skin.  The subcutaneous wounds were closed with 4-0  Vicryl of one simple 0 Vicryl suture in the fascia and the subxyphoid  areas that we could see into the peritoneal cavity and then Benzoin and  Steri-Strips on the skin.  The patient tolerated the procedure nicely  and was extubated and taken to the recovery room in stable postop  condition.  I am going to transfer her to a step-down bed for  approximately 24 hours.  We will add glucose to her IV fluids and we  will resume her usual medications and then hopefully we will get her on  up to her usual chronic medications and since she is in nursing home, it  probably be Monday before she is discharged.  Hopefully, her appetite  will improve.           ______________________________  Anselm Pancoast. Zachery Dakins, M.D.     WJW/MEDQ  D:  09/04/2008  T:   09/04/2008  Job:  [id number]   cc:   Gerrit Friends. Dietrich Pates, MD, [facility]

## 2010-11-29 NOTE — Group Therapy Note (Signed)
[name], [name] NO.:  [id number]   MEDICAL RECORD NO.:  [id number]          PATIENT TYPE:  INP   LOCATION:  2915                         FACILITY:  [facility]   PHYSICIAN:  Marcellus Scott, MD     DATE OF BIRTH:  August 31, 1926                                 PROGRESS NOTE   DATE OF DISCHARGE:  To be determined.   PRIMARY MEDICAL DOCTOR:  Dr. Mirna Mires.   PRIMARY CARDIOLOGIST:  Dr. Dietrich Pates.   DISCHARGE DIAGNOSES:  1. Acute cholecystitis status post percutaneous cholecystostomy on      July 04, 2008.  2. Sepsis secondary to Escherichia coli pyelonephritis, bacteremia,      and cholecystitis.  Sepsis resolved.  3. Escherichia coli pyelonephritis - treated.  4. Escherichia coli bacteremia.  5. Delirium.  6. Dementia.  7. Anemia status post 1 unit of packed red blood cell transfusion.  8. Leukocytosis  9. Dehydration, hyponatremia, acute renal failure, hypoglycemia -      resolved.  10.Uncontrolled diabetes mellitus.  11.Paroxysmal atrial fibrillation/flutter.  Reverted to sinus rhythm.      Not a Coumadin candidate.  12.Coronary artery disease status post coronary artery bypass graft      and status post drug-eluting stent in March of 2009.  Will resume      Plavix.  13.Chronic diastolic congestive heart failure.  Compensated  14.Cholelithiasis.  15.Hypertension.  16.Dyslipidemia  17.Hypothyroidism.  18.Gastroesophageal reflux disease.  19.Full code.  20.Diabetic ketoacidosis - resolved.   DISCHARGE MEDICATIONS:  To be dictated at discharge.   PROCEDURES:  1. Percutaneous cholecystostomy tube placed by interventional      radiology on July 04, 2008, Dr. Fredia Sorrow.  2. Ultrasound of the abdomen on the 16th of December 2009.      Impression:      a.     Small gallstones, but no evidence of cholecystitis.      b.     Bilateral pleural effusions.      c.     Prominent inferior vena cava suggests right heart failure.  3. Chest x-ray on the  16th of December:  Probable CHF with      cardiomegaly, congestion, and effusions.  4. A CT of the abdomen without contrast on the 14th of December.      Impression:      a.     Bilateral pleural effusions with body wall mesenteric edema       compatible with anasarca.      b.     Dilated gallbladder with a few small stones present.      c.     Questioned cholecystitis.      d.     Severe atherosclerosis.      e.     Possible gastroesophageal reflux or poor esophageal       motility.      f.     Severe degenerative disease at L3-L4 and L4-L5.  5. CT of the pelvis without contrast.  Impression:  No acute findings      in the pelvis.  6. Chest x-ray on  the 12th of December.  Impression:  Normal bowel gas      pattern.  7. Chest x-ray on the 11th of December.  Impression:      a.     Right-sided PICC line terminates in the distal SVC just       above the cavoatrial junction.      b.     Stable borderline cardiomegaly without failure.      c.     Status post coronary artery bypass graft.  8. CT of the head without contrast on 10th of December 2009.  No acute      intracranial pathology.  9. Chest x-ray on the 10th of December.  Impression:      a.     Technically limited frontal view.      b.     No gross acute cardiopulmonary disease.      c.     Mild cardiomegaly and bibasilar atelectasis.      d.     Coronary artery bypass graft/median sternotomy.   PERTINENT LABORATORIES:  1. To be finalized on discharge.  CBC today with hemoglobin 11.4,      hematocrit 34, white blood cell 13, platelets 488.  Basic metabolic      panel sample is hemolyzed.  BUN 11, creatinine 0.98.  2. The gallbladder fluid culture is negative to date.  3. Fecal occult blood testing negative.  4. Hemoglobin A1c on the 14th of December 10.7.  RBC folate of 861.      Serum ferritin was 722.  Vitamin B12 341.  Blood culture on the      11th of December:  E. coli in 2 bottles  Blood culture on the 10th      of  December x2 bottles also with E. coli which was sensitive to      Unasyn, cephazolin, cefepime, ceftazidime, ceftriaxone, gentamicin,      imipenem,  Zosyn, tobramycin, Bactrim, and cefoxitin.  Resistant to      ciprofloxacin and ampicillin.  Urine culture also with E. coli      resistant to ampicillin, levofloxacin, and ciprofloxacin, sensitive      to cephazolin, ceftriaxone, gentamicin, nitrofurantoin, tobramycin,      and Bactrim.   CONSULTATIONS:  1. Surgery, Dr. Zachery Dakins.  2. Interventional radiology, Dr. Fredia Sorrow.  3. Cardiology, Dr. Arvilla Meres.   HOSPITAL COURSE AND PATIENT DISPOSITION:  Please refer to the history and physical note for initial admission  details.  In summary, [name] is a 77 year old female patient with  extensive past medical history including coronary artery disease status  post CABG and drug-eluting stent in March of 2009, early dementia,  hypertension, diabetes who was recently treated for diabetic  ketoacidosis and non-ST elevation MI.  Now she re-presented with very  high blood sugars in the 550 range and in diabetic ketoacidosis.  She  also had fevers.  She was thereby admitted for further evaluation and  management.   1. Uncontrolled diabetes mellitus/question diabetic ketoacidosis on      admission.  The patient was admitted to the hospital.  She was      hydrated adequately.  Placed on insulin.  With these measures the      patient's blood sugars have improved.  In fact, she became      hypoglycemic a couple of days ago requiring further reduction and      adjustments of her medications.  Currently her blood sugars range  between 163-275 mg/dL.  To make further adjustments in her      medications.  2. Sepsis secondary to acute E. coli pyelonephritis, bacteremia, and      cholecystitis.  Urinalysis was suggestive of UTI.  The patient was      empirically started on IV ciprofloxacin and vancomycin and Tamiflu.      Subsequently,  however, her urine culture came back with E. coli.      Her antibiotic spectrum was narrowed to ceftriaxone alone.  With      these though she improved, she continued to have abdominal pain.      CT of the abdomen was performed which revealed dilated gallbladder      with gallstone suspicious for cholecystitis.  Currently her sepsis      has resolved.  3. E. coli pyelonephritis.  The patient is on day #10 of IV      ceftriaxone, and I believe this has been treated.  4. E. coli bacteremia secondary to E. coli pyelonephritis and maybe      even the cholecystitis.  Duration of antibiotics has to be      definitely determined, but will probably at least get a total of 2      weeks of ceftriaxone.  5. Acute cholecystitis.  As indicated above despite treating for UTI,      the patient had persisting abdominal pain following which CT      abdomen was done with features suggestive of distention of      gallbladder, cholelithiasis and cholecystitis.  Surgery was      consulted.  The patient had been on Plavix which was held.  They      plan to wait and perform either a laparoscopic cholecystectomy or a      percutaneous cholecystostomy.  Finally, patient had a percutaneous      cholecystostomy on the 19th of December.  She is doing much better      now with no abdominal pain, tolerating p.o., and has been afebrile.      Her white cells have transiently bumped up which was probably      reactionary, but that is improving.  To follow her final      gallbladder culture results.  The patient will have to have the      cholecystostomy drain kept for at least 4-6 weeks, and then patient      is to follow up with surgery to consider elective cholecystectomy.  6. Delirium.  A couple of nights ago, patient became confused and      actually pulled out her PICC line, and was attempting to pull out      her cholecystostomy drain.  This is probably delirium from acute      illness complicating her underlying  dementia.  However, since then      she has done fairly well.  She is cooperative.  Is awake, alert,      oriented x3 today.  7. Anemia.  Patient was transfused a unit of blood to bring up her      hemoglobin to 10 given her history of coronary artery disease.  8. Paroxysmal atrial flutter/atrial fibrillation.  The patient had a      rapid ventricular rate following which she was started on a      Cardizem drip and subsequently switched to p.o. Cardizem.  The      patient has reverted to normal sinus rhythm a few days ago.  She is      not a candidate for anticoagulation per previous cardiology notes.   CODE STATUS:  I have called the patient's daughter, Tonita Phoenix, on multiple  occasions to update her mother's care and have discussed code status.  The patient is full code at this time.   DISPOSITION:  At this time will try to mobilize patient and transfer patient from the  step-down to telemetry bed and request physical therapy, occupational  therapy input, and further disposition based on the patient's progress.      Marcellus Scott, MD  Electronically Signed     AH/MEDQ  D:  07/06/2008  T:  07/06/2008  Job:  [id number]   cc:   Annia Friendly. Loleta Chance, MD  Fax: [phone]   Anselm Pancoast. Zachery Dakins, M.D.  [address], [address]  [place]  [place] [address]   Jodi Marble. Fredia Sorrow, M.D.  Fax: [phone]   Bevelyn Buckles. Bensimhon, MD  [address] [place] [address]   Gerrit Friends. Dietrich Pates, MD, [facility]  [address]  [place], [place] [address]

## 2010-11-29 NOTE — H&P (Signed)
[name], [name] NO.:  [id number]   MEDICAL RECORD NO.:  [id number]          PATIENT TYPE:  OIB   LOCATION:  5128                         FACILITY:  [facility]   PHYSICIAN:  Anselm Pancoast. Weatherly, M.D.DATE OF BIRTH:  October 11, 1926   DATE OF ADMISSION:  09/02/2008  DATE OF DISCHARGE:                              HISTORY & PHYSICAL   CHIEF COMPLAINT:  Poor appetite, known gallbladder problem, history of  coronary artery disease, atrial fibrillation, and diabetes.   HISTORY:  [name] is a 77 year old black female, resides in a  nursing home at [facility], who has had multiple admissions during the  last year here at [facility] with problems of dehydration and her  cardiac problems and developed a cholecystitis that was percutaneously  drained back in December 2009 and she had been on Plavix for long-term.  She had a history of some coronary artery enzyme changes and Dr. Ezzard Standing  elected to have her percutaneously drained back in December 2009,  instead of problems proceeding with laparoscopic cholecystectomy.  She  was discharged with the drain in place, has not had any real abdominal  right upper quadrant pain, but has continued to have a very poor  appetite and etc.  I have seen her in the office and she has not been  acutely ill, and we had her cleared by Dr. Dietrich Pates, her cardiologist,  about stopping the Plavix and then plan on proceeding with a  cholecystectomy.  He thought that she would do satisfactory.  She was  also readmitted, I think, one time back in January 2010 with a kind of  similar symptoms, but since it had not been 6 weeks, surgery was not  anticipated at that time, but it was a kind of the similar symptoms.  The patient's family, she is at a nursing home, states that she just  basically has a poor appetite.  They offer her food, but she rarely eats  it and she came in today electively for the planned cholecystectomy.  However, her preoperative  labs showed a glucose nearly 300, her BUN is  in the 65-70 range with her creatinine significantly elevated, I think  it was nearly 3.  She has had previous problems that her BUN was  elevated, but never to this degree and this is probably basically for  lack of p.o. intake.  She is on Lasix 20 mg a day and her family members  say her sugar is all over the place.  Her blood sugars supposedly at the  nursing home, they state it was 167; however, when she arrived here and  blood work was obtained, it is much higher.  We rechecked an i-STAT and  the i-STAT  shows her potassium was 5.4, on the labs this morning it was  little low at 6, but her BUN and creatinine are significantly elevated,  and her glucoses in the 250-275 range and I think in this setting, this  is not an emergency that it would be best to rehydrate her, get her  cleared medically here and after she is adequately rehydrated,  then  proceed with a cholecystectomy at this time.  The patient's children are  in agreement.  They say she just does not eat is what the real problem  is and I am also sure that since she has got a percutaneous drain that  she is draining a thin, clear bile from her gallbladder that in addition  to the Lasix that she is chronically on tend to contribute also to her  state of dehydration.  She has not complained of any abdominal pain or  problem at this time and the children say that she has really not been  vomiting, she just basically has a very poor appetite.  She was seen by  her cardiologist a week before last and has been off the Plavix now for  approximately 1 week.   PAST HISTORY:  Known coronary artery disease.  She has had coronary  artery bypass.  She is a diabetic of longstanding.  She has a history of  hypertension and she has a history of atrial fibrillation.   CHRONIC MEDICATIONS:  1. She is on a baby aspirin daily.  2. Celexa 1 tablet daily.  3. Plavix has been on hold.  4. Lasix, she  is on 20 mg a day.  5. She is on Synthroid 50 mcg a day.  6. She is on Protonix 40 mg a day.  7. Namenda 1 tablet daily.  8. Zocor 1 tablet a day.  9. Ditropan 1 tablet every day.  10.Detrol.  11.She is on lisinopril 10 mg a day.  12.She is on Cardizem 180 extended release q.24 h.  13.Lopressor 50 mg b.i.d.  14.Pro-Stat.  15.She is on Remeron 15 mg a day.  16.For her diabetes, supposedly, she is on a sliding scale 2 units of      150-206 units, a kind of insulin sensitive coverage and I am not      sure exactly what her standard insulin dose is.   PHYSICAL EXAMINATION:  VITAL SIGNS:  Today, temperature is 98.2, pulse  is 67, respirations 18, and blood pressure is 103/57.  HEENT:  She appears adequately hydrated.  Her tongue is not that dry.  She is able to answer questions appropriately, oriented to time and  place.  LUNGS:  Clear and distant.  She has got a well-healed mediastinotomy  scar.  HEART:  Normal sinus rhythm.  ABDOMEN:  She is not acutely tender.  She has got a drain in the  gallbladder in the right upper quadrant, is draining thin, clear bile.  She is not tender in the lower abdomen.  There is no pedal edema.  Her  peripheral pulses appear physiologic and palpable.  CNS:  Physiologic.   IMPRESSION:  She is symptomatically dehydrated, history of diabetes  insulin controlled, coronary artery disease with a history of cardiac  arrhythmias presently in normal rhythm and on diuretics and hypertensive  medicines chronically.  She would be admitted, restarted on IV fluids.  The dehydration corrected and then we will see about adding her back on  the OR schedule within the next day or two according to time permitting.  I am not going to start on antibiotics, will get the hospitalist to see  her again if they had her in on their numerous admissions since she has  been seen by [facility]. [facility] for cardiac clearance and  management.  We did not appear to have any  acute cardiac issues at this  immediate time.  We  are withholding her Plavix as we are planning to  proceed on with the cholecystectomy in this admission.           ______________________________  Anselm Pancoast. Zachery Dakins, M.D.     WJW/MEDQ  D:  09/02/2008  T:  09/03/2008  Job:  [id number]

## 2010-11-29 NOTE — Letter (Signed)
October 23, 2007    [name] [name] [name]. [name] Chance, MD  [address]  [address]  [name] [name], [name] [name] [address]   RE:  [name] [name] [name], [name] [name] [name]  MRN:  [id number]  /  DOB:  09-30-1926   Dear [name] [name] [name]:   It was a pleasure speaking with you by telephone about [name] [name] [name]'s  care.  As we discussed, she suffered a recent minimal none-Q myocardial  infarction in your office and subsequently underwent drug-eluting stent  implantation in two stenosed saphenous vein grafts.  She did very well  during her hospitalization except for brittle diabetes.  There have been  some issues and discussions with Dr. Ophelia [name] office staff about  obtaining appropriate follow up for [name].  She brings in a list of  capillary blood glucose values that have varied between 50 and 500 at  home.  Her family is using an insulin coverage routine previously  provided to them.   From a cardiac standpoint, [name] [name] [name] appears to be doing well.  She  had one brief episode of chest discomfort since discharge for which she  took a sublingual nitroglycerin.  She is fatigued, but has had no  dyspnea.  Activity has been limited.   CURRENT MEDICATIONS:  1. Clopidogrel 75 mg daily.  2. Amlodipine 5 mg daily.  3. Namenda 10 mg a.m. and 5 mg p.m.  4. Levothyroxine 0.025 mg daily.  5. Metoprolol 50 mg b.i.d.  6. Isosorbide mononitrate 60 mg daily.  7. Simvastatin 40 mg daily.  8. Aspirin 325 mg daily.  9. Lisinopril 20 mg daily.  10.Humalog.  11.Detrol 4 mg daily.  (Lantus was discontinued in the hospital.)   PHYSICAL EXAMINATION:  GENERAL:  A thin, fatigued-appearing [name] [name] in no  acute distress.  VITAL SIGNS:  The weight is 136, 1 pound more than last October.  Blood  pressure 130/60, heart rate 76 and regular, respirations 18.  NECK:  No jugular venous distention; no carotid bruits.  LUNGS:  A few bibasilar rales that clear with cough.  CARDIAC:  Normal first and second heart sounds.  Fourth heart sound  present.  ABDOMEN:  Soft and nontender; no  organomegaly.  EXTREMITIES:  No edema; distal pulses intact.   IMPRESSION:  [name] [name] [name] has recovered well from minor myocardial  infarction and percutaneous intervention.  She had a urine culture in  the hospital that grew mixed species.  A repeat culture and urinalysis  will be obtained.  She was moderately anemic in the hospital with a  hematocrit of 28.  We will recheck a CBC and iron studies in 1 month to  be sure that she is recovering from [name].  Isosorbide mononitrate is  superfluous and will be discontinued.  I will plan to see [name] nice  [name] [name] again in 3 months.    Sincerely,      Jaclyn Friends. Dietrich Pates, MD, [name] [name] [name]  Electronically Signed    RMR/MedQ  DD: 10/23/2007  DT: 10/23/2007  Job #: [id number]

## 2010-11-29 NOTE — Assessment & Plan Note (Signed)
[facility]                       [place] CARDIOLOGY OFFICE NOTE   NAME:[name], [name]                       MRN:          [id number]  DATE:03/30/2008                            DOB:          03/22/1927    CARDIOLOGIST:  Gerrit Friends. Dietrich Pates, MD, [facility]   PRIMARY CARE PHYSICIAN:  Annia Friendly. Hill, MD   REASON FOR VISIT:  Post-hospitalization followup.   HISTORY OF PRESENT ILLNESS:  [name] is a 77 year old female  patient with history of CAD, status post bypass surgery in the 1990s,  who recently suffered a non-ST-elevation myocardial infarction in March  2009, treated with percutaneous intervention x2 to the vein graft to the  first and second obtuse marginal branch and stenting to the vein graft  to first diagonal branch.  She had recurrent pain in April 2009.  A  relook cardiac catheterization at that time revealed patent bypass  grafts and patent stents in the vein grafts as outlined above.  She then  had another admission with diastolic heart failure in May 4098.  Her  last echocardiogram in March 2009, revealed preserved LV function with  an EF of 55-65%.  She recently presented to [facility] from  [facility] with recurrent chest pain.  She ruled out for  myocardial infarction by enzymes and was discharged to home.  She was  set up for outpatient stress testing in our [place] office.  This  revealed mild ischemia in the inferolateral base with an EF of 55%.  In  looking through her records, she had a similar finding on a stress test  done in 2007.  In the office today, she notes that she is overall doing  well.  She is somewhat of a difficult historian.  She really continues  to note chest pain off and on for which she takes nitroglycerin.  It is  difficult to tell whether or not her symptoms have increased.  However,  overall she does not seem to be bothered by this.  She denies any  significant shortness of  breath.  She denies any recent syncope.  She  did have a hypoglycemic episode recently.  She sleeps on 2 pillows.  She  occasionally awakens short of breath, but this seems to be mild without  significant change.  She denies significant pedal edema.   CURRENT MEDICATIONS:  Plavix 75 mg daily, Norvasc 5 mg daily, Namenda 10  mg in the morning, 5 mg in the evening, Synthroid 50 mcg daily,  Metoprolol 50 mg daily, Zocor 40 mg nightly,  Aspirin 325 mg, Lisinopril 10 mg daily, Humalog sliding scale insulin,  Detrol 4 mg daily, Lasix 20 mg daily, Lantus 25 units nightly,  Nitroglycerin p.r.n. chest pain.   PHYSICAL EXAMINATION:  GENERAL:  She is a well-nourished, well-developed  female, in no acute distress.  VITAL SIGNS:  Blood pressure is 150/70, pulse 68, weight 140 pounds.  HEENT:  Normal.  NECK:  Without JVD at 90 degrees.  CARDIAC:  Normal S1 and S2.  Regular rate and rhythm without murmur.  LUNGS:  Clear to auscultation bilaterally without  wheezing, rhonchi or  rales.  ABDOMEN:  Soft, nontender with normoactive bowel sounds.  No  organomegaly.  EXTREMITIES:  Without no edema.  NEUROLOGIC:  She is alert and orient x3.  Cranial II-XII are grossly  intact.   ASSESSMENT AND PLAN:  1. Coronary artery disease, status post prior bypass surgery and      recent non-ST-elevation myocardial infarction, treated with      stenting to the vein graft to the diagonal and vein graft to obtuse      marginal #1 and #2.  She had subsequent cardiac catheterization in      April 2009 that revealed patent bypass grafts and patent stents in      her vein grafts.  She has had preserved LV function by recent      echocardiography.  She had a recent stress test that reveals some      mild inferolateral ischemia.  This is similar to her prior study.      I reviewed this with Dr. Dietrich Pates.  He felt that this was overall a      low-risk study.  At this point in time, we will recommend      continuing with  medical therapy.  I have asked her to increase her      Norvasc to 10 mg a day.  2. Chronic diastolic congestive heart failure.  Overall, she seems to      be optivolemic.  No changes we made in her diuretic therapy at this      time.  3. Hypertension.  This is somewhat elevated.  As noted above, her      Norvasc will be increased.  4. Chronic renal insufficiency.  Recent lab work dated March 18, 2008, revealed a creatinine of 1.07.  This is overall stable.  5. Dyslipidemia.  She will continue on simvastatin.  6. History of atrial flutter, status post ablation in 2004.  By exam,      she seems to be maintaining sinus rhythm.   DISPOSITION:  The patient will be brought back in followup with Dr.  Dietrich Pates in the next 2 months or sooner p.r.n.      Tereso Newcomer, PA-C  Electronically Signed      Gerrit Friends. Dietrich Pates, MD, [facility]  Electronically Signed   SW/MedQ  DD: 03/30/2008  DT: 03/31/2008  Job #: [id number]   cc:   Annia Friendly. Loleta Chance, MD

## 2010-11-29 NOTE — Consult Note (Signed)
NAMEZIAIRE, [name]:  [id number]   MEDICAL RECORD NO.:  [id number]          PATIENT TYPE:  INP   LOCATION:  2108                         FACILITY:  [facility]   PHYSICIAN:  Rollene Rotunda, MD, FACCDATE OF BIRTH:  Jun 06, 1927   DATE OF CONSULTATION:  03/09/2009  DATE OF DISCHARGE:                                 CONSULTATION   PRIMARY CARE PHYSICIAN:  [facility].   PRIMARY CARDIOLOGIST:  Gerrit Friends. Dietrich Pates, MD, [facility]   CHIEF COMPLAINT:  Chest pain.   HISTORY OF PRESENT ILLNESS:  [name] is an 77 year old female with  a history of coronary artery disease.  She was found by the nursing home  staff with altered mental status.  She was felt to be cool to touch and  she also complained of chest pain.  Her heart rate was in the 30s and  initial blood pressure not palpable.  She was transported by EMS and  externally paced on the way in.  In the emergency room, her EKG shows  atrial fibrillation with slow ventricular response.  She had significant  electrolyte abnormalities including a potassium of 7.2, which is being  corrected.  Cardiology is asked to evaluate her.   [name] remembers having chest pain.  She is unable to quantify or  qualify it.  She says she got some medicine and she thinks it was either  Tylenol or aspirin and the chest pain is gone now.  She is not sure when  it started.  Before today she cannot remember when the last episode of  chest pain was.  She is not very active and denies any exertional  symptoms, although her memory is poor.  Currently, she is being warmed  and denies any discomfort.   PAST MEDICAL HISTORY:  1. Status post aortocoronary bypass surgery x5 in 1998 with LIMA to      LAD, SVG to diagonal, SVG to PDA, SVG to OM-1 and OM-2.  2. Non-ST segment elevation MI in March 2009 with cath showing severe      native three-vessel disease and SVG to diagonal 90% to 0, SVG to OM-      1 99% after OM-1 reduced to 0,  both with drug-eluting stents.  3. Status post echocardiogram in November 2009 showing an EF of 65%      with moderate-to-severe MR and TR.  4. Diabetes.  5. Hypertension.  6. Hyperlipidemia.  7. History of paroxysmal atrial fibrillation.  8. History of atrial flutter status post ablation (not a Coumadin      candidate secondary to falls).  9. Dementia.  10.Peripheral vascular disease with percutaneous intervention to the      right femoral artery.  11.Gastroesophageal reflux disease.  12.Anemia.   SURGICAL HISTORY:  She is status post cardiac catheterizations as well  as bypass surgery, incision and drainage left elbow for an infection,  electrophysiology study and ablation, PTA to the right femoral artery,  and laparoscopic cholecystectomy.   ALLERGIES:  PENICILLIN.   CURRENT MEDICATIONS:  1. Aspirin 81 mg daily.  2. Ditropan 10 mg  daily.  3. Cardizem CD 180 mg daily.  4. Celexa 20 mg a day.  5. Synthroid 50 mcg daily.  6. Plavix 75 mg daily.  7. Lopressor 50 mg daily.  8. Prilosec OTC 20 mg a day.  9. Namenda 10 mg daily.  10.Multivitamin daily.  11.Iron 325 mg daily.  12.Lisinopril 5 mg daily.  13.Prostat 64 Liquid 30 mL b.i.d.  14.Systane eye drops twice daily.  15.Zocor 40 mg a day.  16.Remeron 45 mg one-half tab at bedtime.  17.Sliding scale insulin before meals and at bedtime.  18.NovoLog 3 units before meals.  19.Lantus 5 units at bedtime.  20.Glucerna twice a day with meals.  21.Lasix 20 mg daily p.r.n.  22.Vicodin 5 mg p.r.n.   SOCIAL HISTORY:  She lives in [place] alone.  She is retired.  She denies any history of alcohol, tobacco, or drug abuse.  She does not  exercise regularly.   FAMILY HISTORY:  Both of her parents are deceased.  She is not aware of  any coronary artery disease in any siblings or either parents.   REVIEW OF SYSTEMS:  She describes burning with urination.  Her memory is  poor.  She has some chronic arthralgias.  She has  been very cold.  She  denies melena and is not currently experiencing any reflux symptoms.  She admits to shortness of breath when she gets up and moves around.  Full 14-point review of systems is otherwise negative.   PHYSICAL EXAMINATION:  VITAL SIGNS:  Temperature initially 95, now 97.3  by temp probe, initial blood pressure 94/42, now 120/70, heart rate 46,  respiratory rate 20, O2 saturation 96% on 2 L.  GENERAL:  She is a frail elderly African American female in no acute  distress.  HEENT:  Essentially normal except for poor dentition.  NECK:  There is no lymphadenopathy or thyromegaly noted.  She has a  right carotid bruit and JVD is noted, but she is supine and they  requested we not elevate her.  CV:  Heart is slightly irregular in rate and rhythm with an S1 and S2  and 2 or 3/6 systolic murmur is noted at the left sternal border.  Distal pulses are very weak in her lower extremities and she has  bilateral femoral bruits.  LUNGS:  She has got a few rales in the bases.  ABDOMEN:  Soft and nontender with decreased bowel sounds, but they are  present.  EXTREMITIES:  There is no cyanosis, clubbing, or edema noted.  MUSCULOSKELETAL:  There is no joint deformity or effusions and no spine  or CVA tenderness is noted.  NEURO:  She is alert and oriented to name and thinks she is at [employer].  She knows she is in the hospital.  She does not know the date,  but knows it is August.  Cranial nerves II through XII are grossly  intact and she is weak.   CHEST X-RAY:  Cardiomegaly, no acute disease.   EKG:  Atrial fibrillation, rate 49 at this time.  The only earlier EKG  available is when EMS was pacing her.  There are no acute ischemic  changes and T-waves are not extremely high or peak.   LABORATORY DATA:  Hemoglobin 8.8, hematocrit 26.3, WBC is 11.7,  platelets 259.  Sodium 123, potassium 7.2, chloride 98, CO2 of 18, BUN  44, creatinine 2.67, glucose 371.  BNP 208.  CK-MB and  troponin negative  x1.  Lactic acid  8.6.  Acetone negative.  Urinalysis showing many  bacteria, large amount of leukocyte esterase, and white blood cells too  numerous to count.   IMPRESSION:  [name] was seen by Dr. Antoine Poche.  She presented with  altered mental status and was found to be bradycardic with atrial  fibrillation, slow ventricular rate.  There is no acute ST or T-wave  change.  We were asked to evaluate her.  She was subsequently found to  have multiple metabolic abnormalities with the urinary tract infection  and lactic acidosis, renal insufficiency, anemia.  They have started  correcting her metabolic abnormalities and started hydration.  Her blood  pressure is improved and her heart rate is in the 40s.  The history is  vague with questionable chest pain earlier, but denies it currently.  She also denies shortness of breath and PND.   PLAN:  Bradycardia:  This is likely secondary to other issues.  It is  doubtful we will need to do any Myoview or other cardiac evaluation.  She was on both Cardizem and Lopressor prior to admission and these have  been held.  We will follow her along with you and continue to cycle  cardiac enzymes.  She is not a Coumadin candidate secondary to fall  risk.  We will continue to follow her closely with you, but we will  leave other medical issues to the critical care medicine team.  Consideration can be given to further.      Theodore Demark, PA-C      Rollene Rotunda, MD, [facility]  Electronically Signed    RB/MEDQ  D:  03/09/2009  T:  03/09/2009  Job:  [id number]

## 2010-11-29 NOTE — Discharge Summary (Signed)
[name], [name]:  [id number]   MEDICAL RECORD NO.:  [id number]          PATIENT TYPE:  INP   LOCATION:  2037                         FACILITY:  [facility]   PHYSICIAN:  Jonelle Sidle, MD DATE OF BIRTH:  March 18, 1927   DATE OF ADMISSION:  02/24/2008  DATE OF DISCHARGE:                               DISCHARGE SUMMARY   ADDENDUM:  She follows up with Dr. Dietrich Pates Monday, March 16, 2008 at  10:30.   Also the patient has nitroglycerin 0.4 mg sublingual as directed.      Maple Mirza, [place]      Jonelle Sidle, MD  Electronically Signed    GM/MEDQ  D:  02/27/2008  T:  02/27/2008  Job:  [id number]   cc:   Gerrit Friends. Dietrich Pates, MD, [facility]  Duke Salvia, MD, [facility]

## 2010-11-29 NOTE — H&P (Signed)
[name], FAW NO.:  [id number]   MEDICAL RECORD NO.:  [id number]          PATIENT TYPE:  INP   LOCATION:  6532                         FACILITY:  [facility]   PHYSICIAN:  Unice Cobble, MD     DATE OF BIRTH:  [birth year]   DATE OF ADMISSION:  03/18/2008  DATE OF DISCHARGE:                              HISTORY & PHYSICAL   CARDIOLOGIST:  Gerrit Friends. Dietrich Pates, MD, [facility].   CHIEF COMPLAINT:  Chest pain.   HISTORY OF PRESENT ILLNESS:  This is a 77 year old African American  female with a history of coronary disease status post CABG who had chest  pain this evening at rest, radiating to her left arm, associated with  shortness of breath, diaphoresis, nausea and presyncope.  The pain  resolved after three sublingual nitroglycerin.  The patient states that  her chest hurts to press on it currently.  She does not have any  orthopnea, PND or edema.  She has had a recent admission for this in  early August after which she was discharged with increased medical  therapy.   PAST MEDICAL HISTORY:  1. Coronary artery disease status post five-vessel CABG in 1990s.      Catheterization in April 2009 showed patent stents and grafts.  2. Non-STEMI March 2009 with PCI to saphenous vein graft x2.  Follow-      up catheterization as above.  3. Hypertension.  4. Atrial flutter status post ablation in June 2004.  5. Dyslipidemia.  6. IBDS.  7. Chronic renal insufficiency.  8. Hypothyroidism.  9. Iron deficiency anemia.   ALLERGIES:  PENICILLIN.   MEDICATIONS:  1. Lasix 20 mg daily.  2. Aspirin 325 mg daily.  3. Plavix 75 mg daily.  4. Lisinopril 20 mg daily.  5. Detrol 4 mg daily.  6. Metoprolol 50 mg b.i.d.  7. Namenda 10 mg in the morning and 5 mg in the evening.  8. Norvasc 5 mg daily.  9. Synthroid 25 mcg daily.  10.Zocor 40 mg q.h.s.  11.Insulin sliding scale.  12.Lantus 25 units q.h.s.  13.Nitroglycerin as needed.   SOCIAL HISTORY:  She lives in [facility] in  [place].  She is widowed.  No tobacco, alcohol.   FAMILY HISTORY:  Noncontributory.   REVIEW OF SYSTEMS:  Review of systems done found to be otherwise  negative as stated in HPI.   PHYSICAL EXAMINATION:  VITAL SIGNS:  Temperature is 98.3 with a pulse of  96.  Her respiratory rate is 20.  Her blood pressure is 145/75  increasing to 200/92 in the emergency room, O2 sats are 100% on 2  liters.  GENERAL:  She is in no acute distress.  Thin.  HEENT:  NCAT, PERRLA, EOMI, MMM.  Oropharynx without erythema or  exudates.  NECK:  Supple without lymphadenopathy, thyromegaly, bruits or jugular  venous distention.  HEART:  Regular rate and rhythm with S1-S2.  She has 2/6 systolic murmur  in the left upper sternal border.  Pulses are 2+ and equal bilaterally  without bruits.  LUNGS:  Clear to auscultation  bilaterally.  SKIN:  Without rashes or lesions.  ABDOMEN:  Soft and nontender with normal bowel sounds.  No rebound or  guarding.  Negative hepatosplenomegaly.  EXTREMITIES:  Show no cyanosis,  clubbing or edema.  She has a tender left rib cage.  Otherwise, her  musculoskeletal exam shows no joint deformity or effusions.  No spine or  CVA tenderness.  NEUROLOGICAL:  She is alert and oriented x3 with cranial nerves II-XII  grossly intact.  Strength 5/5 all extremities and axial groups with  normal sensation throughout.   RADIOLOGY:  Chest X-Ray: Shows no acute cor cardiopulmonary process.  EKG shows a rate of 80 in normal sinus rhythm.  She has some nonspecific  lateral ST and T-wave changes.  She has a left anterior fascicular  block.   LABORATORY DATA:  Glucose of 334 with a creatinine of 1.2.  Her CK-MB  and troponin are negative.   ASSESSMENT/PLAN:  This is a very pleasant 77 year old African American  female with a history of CABG and recent PCI with a presentation of  atypical chest pain.  Her description of symptoms is classic for  unstable angina, but she  also has a confusing picture of chest wall  tenderness.  Her blood pressure is high, and this may be causing pain as  well.  I will admit her to rule out myocardial infarction and for better  blood pressure control.  In this regard, I will increase her lisinopril  to 40 mg daily.  If she rules out for myocardial infarction.  She will  likely need a stress test in the morning.  However, the utility of this  is in question as I suspect it would be positive considering her past  medical history.  I will leave this is to the attending's discretion.      Unice Cobble, MD  Electronically Signed     ACJ/MEDQ  D:  03/18/2008  T:  03/18/2008  Job:  [id number]

## 2010-11-29 NOTE — H&P (Signed)
[name], [name]:  [id number]   MEDICAL RECORD NO.:  [id number]          PATIENT TYPE:  INP   LOCATION:  1429                         FACILITY:  [facility]   PHYSICIAN:  Lonia Blood, M.D.      DATE OF BIRTH:  [birth year]   DATE OF ADMISSION:  10/20/2008  DATE OF DISCHARGE:                              HISTORY & PHYSICAL   PRIMARY CARE PHYSICIANS:  [facility], Dr. Daiva Eves and Dr.  Marijo Conception.   PRESENTING COMPLAINT:  Falls and weakness.   HISTORY OF PRESENT ILLNESS:  The patient is an 77 year old female  brought in from [place] secondary to dehydration, weakness, and  falls.  She also had increased temperature.  The patient apparently  started having problem over the weekend when her temperature was  elevated and she had evidence of UTI.  She was given some Levaquin, but  then her CBG's had remained elevated.  She continued to be weak, had a  poor appetite, and fell at least twice today.  She continued to have  some headache with numbness of her forehead where she fell.  The patient  has declined to the extent that she could not work.  She was  subsequently transferred to the hospital for further management.  The  patient is able to talk and communicate.  Mainly complained of headache  at this point and weight loss in the past couple of weeks.  She denied  any nausea, vomiting or diarrhea.  Denied any abdominal pain.  Denied  any dysuria.  Denied any chills or rigors.   PAST MEDICAL HISTORY:  Significant for:   1. Diabetes.  2. Paroxysmal atrial fibrillation.  3. Coronary artery disease status post CABG.  4. Dementia.  5. Hypertension.  6. Dyslipidemia.  7. Depression.  8. Hypothyroidism.  9. Anemia.  10.Renal insufficiency.  11.The patient also has diagnosis of congestive heart failure,      although her most recent EF is not known at this point.   ALLERGIES:  She is allergic to PENICILLIN.   MEDICATIONS:  1. Aspirin 81 mg  daily.  2. Lasix 20 mg daily.  3. Zocor 40 mg daily.  4. Oxybutynin ER 10 mg daily.  5. Lisinopril 10 mg daily.  6. Cardizem CD 180 mg daily.  7. Celexa 20 mg daily.  8. Synthroid 50 mcg daily.  9. Plavix 75 mg daily.  10.Lopressor 50 mg daily.  11.Omeprazole 20 mg daily.  12.Namenda 10 mg daily.  13.Remeron 22.5 mg q.h.s.  14.Hydrocodone acetaminophen 5/500 one tablet q.6 hours p.r.n.  15.Glucerna 1 can p.o. t.i.d.   SOCIAL HISTORY:  The patient lives at [employer].  She has mild  dementia, but able to participate in activities and walk around prior to  this.  No history of tobacco or alcohol or IV drug use.   FAMILY HISTORY:  Noncontributory due to patient's age.   REVIEW OF SYSTEMS:  A 14-point review of systems as per HPI.  Of note,  patient had chronic cholecystitis this past February with stone.  She  had percutaneous drainage  of acute cholecystitis.   PHYSICAL EXAMINATION:  Today temperature was 97.9, initial blood  pressure 88/45, currently 137/59 with a pulse of 69, respiratory rate of  18, sats 100% on room air.  GENERAL:  The patient is awake.  She is alert, seems oriented x3.  She  is in no acute distress.  HEENT:  PERRL, EOMI.  NECK:  Supple.  No JVD, no lymphadenopathy.  RESPIRATORY:  She has good air entry bilaterally.  No wheezes or rales.  CARDIOVASCULAR:  She has S1, S2.  No murmur, no rales.  ABDOMEN:  Flat, soft, nontender with positive bowel sounds.  EXTREMITIES:  No edema, cyanosis or clubbing.  NEUROLOGICAL:  Nonfocal with muscle tone 5/5 upper and lower  extremities, respectively.   LABORATORIES:  White count is 6.2, hemoglobin 9.4, platelet count 188,  normal differentials.  Sodium  132, potassium 5.3, chloride 101, CO2 21,  glucose 298, BUN 41, creatinine 2.39. Albumin 3.0.  Calcium 9.0.  The  rest of the LFTs normal.  Urinalysis showed a turbid urine with moderate  blood, large protein, and large leukocyte esterase.  Urine microscopy   showed few squamous epithelial cells, wbc's are too numerous to count,  many bacteria, rbc's 3-6, some yeast.  Chest x-ray showed no acute  cardiopulmonary abnormalities.  Head CT without contrast showed no acute  intracranial abnormalities.  There was chronic small vessel white matter  disease.   ASSESSMENT:  Therefore this is an 77 year old female with multiple  medical problems presenting with what appears to be persistent urinary  tract infection probably not responsive to quinolones, acute renal  failure, and dehydration.  The patient also had falls at least twice.   PLAN:  1. UTI.  The patient may have had recurrent UTI or persistent UTI due      to resistant bacteria to quinolones.  I will put her on IV Rocephin      while awaiting urine culture, getting blood culture.  The patient      is allergic to PENICILLIN, but hopefully the allergy is not      anaphylactic, and we may be able to use the Rocephin instead.  2. Acute renal failure.  This may be prerenal.  Will hydrate the      patient.  Also the patient has been on lisinopril and Lasix which      will hold at this point.  I will follow her renal function as she      improves.  3. Diabetes.  I will continue with her home medication with some      sliding scale insulin.  The patient seemed not to be on any Lantus      at this point.  It has been held due to hypoglycemia in the past.      I will, however, go ahead and would start sliding scale insulin      plus or minus Lasix depending on the patient's response.  4. Dementia.  This seems to be stable.  I will continue with her home      therapy.  5. Dyslipidemia.  I will hold the Zocor for now until she fully      recovers.  We will check fasting lipid panel.  6. Hypothyroidism.  Again I will continue with her home therapy.  7. Hyponatremia.  This is some pseudohyponatremia most likely.  Will      correct the sugar and follow her sodium level.  8. Hypertension again due to  dehydration.  Will hydrate her cautiously      knowing that she has history of CHF.  9. Depression.  Again will continue with the patient's home therapy as      much as possible.  10.Hypertension.  Blood pressure is low now, although she has bounced      back.  Will gradually put her back on her medicine as she fully      recovers.  11.Normocytic anemia.  This is most likely anemia of chronic disease.      We will follow closely.  Further treatment will depend on how the      patient does in the hospital in response to these measures.      Lonia Blood, M.D.  Electronically Signed     LG/MEDQ  D:  10/20/2008  T:  10/20/2008  Job:  [id number]

## 2010-11-29 NOTE — H&P (Signed)
[name], [name]:  [id number]   MEDICAL RECORD NO.:  [id number]          PATIENT TYPE:  INP   LOCATION:  1823                         FACILITY:  [facility]   PHYSICIAN:  Eduard Clos, MDDATE OF BIRTH:  1927/05/07   DATE OF ADMISSION:  06/26/2008  DATE OF DISCHARGE:                              HISTORY & PHYSICAL   HISTORIAN:  History provided by patient's daughter and ER physician and  patient.   CHIEF COMPLAINT:  High blood sugar and increased blood pressure.   HISTORY OF PRESENT ILLNESS:  A 77 year old female who was recently  discharged last month after being treated for diabetic ketoacidosis and  non-ST MI, presented to the ER with the patient's family.  The patient  has been having increased blood sugar which is unrecordable by  Glucometer and subsequently in the ER, it was found her blood sugars  were higher than 550.  In the ER, the patient was also found to have an  anion gap of 16 at this time and was admitted for further management of  her uncontrolled diabetes.  On admission, the patient also had been  having fever over the last few hours.  The patient is quite restless,  denies any chest pain, shortness of breath, abdominal pain, nausea,  vomiting, loss of consciousness, dizziness or dysuria, discharge or  diarrhea.   PAST MEDICAL HISTORY:  See discharge.  1. Status post CABG.  2. Hypertension.  3. Hyperlipidemia.  4. Early dementia.   PAST SURGICAL HISTORY:  1. CABG.  2. Cardiac stenting in March 2009.   MEDICATIONS:  Prior to admission:  1. Detrol LA 4 mg  p.o. daily.  2. Metoprolol 50 mg p.o. daily.  3. Namenda 10 mg p.o. b.i.d.  4. Plavix 75 mg p.o. daily.  5. Levothyroxine 50 mg p.o. daily.  6. Simvastatin 40 mg daily.  7. Aspirin 81 mg p.o. daily.  8. Amlodipine 10 mg p.o. daily.  9. Lasix 20 mg p.o. daily.  10.Lisinopril 20 mg p.o. daily.  11.Lantus insulin 20 units subcu q.12.  12.Ambien 10 mg p.o. p.r.n. bedtime.  13.Hydrocodone p.r.n. for pain.  14.Omeprazole 20 mg p.o. daily.   ALLERGIES:  PENICILLIN.   SOCIAL HISTORY:  The patient lives with her daughter.  Denies smoking  cigarettes, drinking alcohol, using illegal drugs.   FAMILY HISTORY:  Noncontributory.   REVIEW OF SYSTEMS:  As per history of present illness.  Nothing else  significant.   PHYSICAL EXAMINATION:  GENERAL:  Patient examined at bedside, not in  acute distress.  VITAL SIGNS:  Blood pressure is 139/80, pulse 110 per minute,  temperature max was 104.9, respirations 18 per minute, O2 saturation  97%.  HEENT:  Nonicteric, no pallor, no rigidity.  No facial asymmetry.  CHEST:  Bilateral air entry, depressant, no rhonchi, no crepitation.  HEART:  S1, S2. Heard.  ABDOMEN:  Soft, nontender, bowel sounds heard.  No guarding, no  rigidity.  CNS is alert and oriented to time, place and person.  Moves  upper and lower extremities, 5/5 peripheral pulses felt.  No edema.  LABORATORY DATA:  EKG, normal sinus rhythm.  Poor R wave progression.  Heart rate around 99 beats per minute.  CT of the head without contrast  shows no acute intracranial pathology.  Chest x-ray no gross acute  cardiopulmonary disease.  WBC 11.3, hemoglobin 11.4, hematocrit 34.5,  platelets 463, neutrophils 85%.  PT/INR 14.6 and 1.1.  Complete  metabolic panel:  Sodium 140, potassium 4.4, chloride 101, CO2 23,  glucose 493, anion gap 16, BUN 29, creatinine 145.  Total bilirubin 0.9,  alk-phos 100, AST 21, ALT 9, calcium 9.9.  Acetone negative.  Lactic  acid 2.3.  troponin I less than 0.05.  UA shows serum glucose more than  1000.  Ketone 15.  Moderate nitrites, negative leukocyte esterase.  WBC  7-10.  Cultures of blood and urea are pending.   ASSESSMENT:  1. Diabetic ketoacidosis.  2. Fever probably from UTI.  3. CHF for CBG.  4. Hypertension.  5. Hyperlipidemia.  6. Hypothyroidism.   PLAN:  Admit the patient to stepdown unit, place patient on glucose   stabilizer due to her DKA. Follow B-mets carefully.  IV fluids.  Will  start the patient on vancomycin and Cipro and Tamiflu.  Doppler  precautions.  Follow cardiac enzymes.  Resume her home medications  except for Lantus.  Continue aspirin and Plavix, GI prophylaxis along  with Lovenox for DVT prophylaxis and further recommendations as patient  evolves.      Eduard Clos, MD  Electronically Signed     ANK/MEDQ  D:  06/26/2008  T:  06/26/2008  Job:  [id number]

## 2010-11-29 NOTE — Consult Note (Signed)
[name], [name]:  [id number]   MEDICAL RECORD NO.:  [id number]          PATIENT TYPE:  INP   LOCATION:  6525                         FACILITY:  [facility]   PHYSICIAN:  Luis Abed, MD, FACCDATE OF BIRTH:  17-Apr-1927   DATE OF CONSULTATION:  DATE OF DISCHARGE:                                 CONSULTATION   Consult requested by ED physician, [facility].   CHIEF COMPLAINT:  Altered mental status.   REASON FOR CONSULTATION:  Elevated cardiac enzymes.   HISTORY OF PRESENT ILLNESS:  The patient is a 77 year old woman with  history of coronary artery disease status post CABG and PCI,  hypertension, diabetes on insulin, hyperlipidemia, and hypothyroidism.  The patient presents with altered mental status.  Per ED note, the  patient also had chest pain prior to presentation as mentioned by the  patient's family.  However, the patient's family were unavailable at the  time of this consult and hence we were unable to ascertain the  occurrence of chest pain.  The patient states that she feels very weak  and has low appetite and that is why she has been brought to the ED.  She denies any chest pain, shortness of breath, cough, fever, nausea,  vomiting, diarrhea, palpitations, syncope, loss of consciousness,  weakness, speech or swallowing difficulty.   ALLERGIES:  The patient is allergic to PENICILLIN.   PAST MEDICAL HISTORY:  1. Coronary artery disease status post 5-vessel CABG done in 1990,      history of STEMI in March 2009 at which time she received PCI to      the saphenous venous graft.  Most recent catheterization in 2009      that showed patent stents and grafts.  2. Hypertension.  3. Atrial flutter.  4. Dyslipidemia.  5. Chronic renal insufficiency with last known creatinine of 1.07 from      September 2009.  6. Hypothyroidism.  7. Iron deficiency anemia.  8. Diabetes mellitus.  9. Dementia.   CURRENT MEDICATIONS:  1. Lasix 20 mg  daily.  2. Aspirin 325 mg.  3. Lisinopril 20 mg daily.  4. Detrol LA 4 mg daily.  5. Namenda 10 mg in the a.m. and 5 mg in the p.m.  6. Norvasc 5 mg once daily.  7. Synthroid 50 mcg once daily.  8. Zocor 40 mg in the evening time.  9. Lantus 25 units subcutaneous.  10.Artificial tears.  11.NovoLog sliding scale.  12.Nitro sublingual.  13.Metoprolol extended-release 50 mg once daily.   SOCIAL HISTORY:  The patient is recently discharged from [facility] and currently lives with her daughter.  There is no history  of tobacco, alcohol, or illicit drug abuse.   FAMILY HISTORY:  Noncontributory.   REVIEW OF SYSTEMS:  As per HPI.  The patient was unable to give details  of her problems because of altered mental status.   PHYSICAL EXAMINATION:  VITAL SIGNS:  Temperature 97.2, pulse 65,  respiratory rate 18, blood pressure 131/66, and saturation 100% on room  air.  GENERAL:  NAD.  HEENT:  [place]/AT, PERRLA, EOMI, dry mucous membranes.  NECK:  Supple.  Lymphadenopathy, none.  CARDIOVASCULAR:  Regular rate and rhythm with normal heart sounds.  No  murmur, rubs, or gallops could be appreciated.  LUNGS:  Clear to auscultation bilaterally.  SKIN:  No rash or lesions.  ABDOMEN:  Soft and nontender.  EXTREMITIES:  No cyanosis, clubbing, or edema.  NEUROLOGIC:  Alert and oriented to place and person, but not to time.  Otherwise, nonfocal exam.   LABORATORY DATA:  1. Chest x-ray negative for acute abnormalities.  2. An electrocardiogram, heart rate of 69 beats per minute.  The      patient does have poor progression of R-waves on the anterior chest      leads with ST depressions on V5 and V6, but these changes are      consistent with her old EKG findings.  3. CBC showed hemoglobin of 11.5, WBC 12.1 with 75% neutrophils,      platelet 184.  The patient's BMET showed sodium of 130 with      potassium of 5.2, chloride 95, bicarb 19, BUN 76, creatinine 2.05,      and blood glucose  662.  Lipase 13.  Lactic acid 4.5.  CK-MB 9.5,      troponin-I 0.3.  Myoglobin more than 500.   Urinalysis is positive for ketones   ASSESSMENT AND PLAN:  This is a 77 year old woman with multiple medical  problems as follows.  1. Chest pain.  The patient is pain free at the moment and does not      have any electrocardiographic changes.  She is hemodynamically      stable.  She does have elevated troponins and MB and we agreed that      she needs to be ruled out for unstable angina given her multiple      risk factors.  However, the patient is currently clinically stable      and hence will not require any immediate cardiac intervention.  We      recommend admitting the patient to a step-down unit and continuing      her antianginal medications.  Cardiac enzymes will have to be      cycled and repeat electrocardiograms will have to be obtained to      rule out acute coronary syndrome.  2. Altered mental status.  This is likely multifactorial related to      hyperglycemia with or without diabetic ketoacidosis and      hyperosmolar nonketotic, acute coronary syndrome, renal impairment,      dementia.  3. Hyperglycemia.  She does have borderline gap acidosis; she could be      having hyperosmolar nonketotic as well.  Patient needs aggressive      hydration and IV insulin treatment.  4. Renal impairment.  This is most likely prerenal.  We recommend      aggressive hydration and holding her ACE inhibitors and Lasix and      Norvasc.  5. Thyroid dysfunction.  We recommend checking her TSH and continuing      her Synthroid.  6. Dementia.  The patient can continue her medication memantine.   Thank you very much for this interesting consult.  As noted above, we  will follow closely with you in terms of dealing with any cardiac  problem.      Zara Council, MD  Electronically Signed      Luis Abed, MD, [facility]  Electronically Signed  AS/MEDQ  D:  05/20/2008  T:  05/21/2008   Job:  [id number]

## 2010-11-29 NOTE — Discharge Summary (Signed)
[name], [name] NO.:  [id number]   MEDICAL RECORD NO.:  [id number]          PATIENT TYPE:  INP   LOCATION:  2037                         FACILITY:  [facility]   PHYSICIAN:  Arturo Morton. Riley Kill, MD, FACCDATE OF BIRTH:  [birth year]   DATE OF ADMISSION:  02/24/2008  DATE OF DISCHARGE:  02/27/2008                               DISCHARGE SUMMARY   ALLERGIES:  PENICILLIN.   FINAL DIAGNOSES:  1. Admitted with sharp chest pain 7 p.m. August 11 at nursing home.      a.     The pain moved laterally to beneath the left axilla.      b.     Nitroglycerin at nursing home relieved the pain with no       recurrence.      c.     Troponin I studies negative x3.  2. Evidence of atrial fibrillation (flutteroid pattern/coarse) this      admission.      a.     It is paroxysmal.      b.     Question anticoagulation with Coumadin.      c.     The patient is currently on aspirin and Plavix.      d.     CHADS equaled 3, hypertension, age and diabetes.   SECONDARY DIAGNOSES:  1. History of three vessel coronary artery disease status post      coronary artery bypass graft surgery in the late 1990s.  2. History of non-Q-wave myocardial infarction March of 2009 with      stents placed to the distal saphenous vein graft to the right      coronary artery and a graft placed in the sequential segment of the      saphenous vein graft to the first obtuse marginal and then to the      second obtuse marginal.  3. The patient had re-cath April of 2009.  The stents were patent.      The grafts were patent.  The patient does have severe three vessel      coronary artery disease with 90% mid LAD and the left circumflex      and the right coronary artery are totaled.  4. History of atrial flutter status post radiofrequency catheter      ablation in June of 2004.  5. Hypertension.  6. Dyslipidemia.  7. Diabetes.  8. Treated hypothyroidism.  9. Iron deficiency anemia.  10.Irritable bowel  syndrome.  11.Chronic renal insufficiency.   PROCEDURES:  No specific procedures this admission.  The patient was  observed on telemetry.   BRIEF HISTORY:  [name] is a 77 year old female.  She has a history  of coronary artery disease.  She had an NSTEMI March of 2009.  She had a  catheterization April of 2009.  The study showed three vessel native  coronary disease.  The grafts were patent.  Of note in March of 2009 she  did receive two stents to two of her grafts.   The patient states that she had chest pain which began about 5:00 the  afternoon of August 10.  She describes it as a sharp stabbing pain.  It  is associated with nausea and some shortness of breath.  She does  mention that the pain moved laterally to beneath her left axilla.  The  staff gave her nitroglycerin which removed the pain.  The staff did feel  her symptoms warranted evaluation in the emergency room.   The patient describes a rather benign course in the nursing home.  She  was recently admitted for chest pain.  She denies exertional chest pain,  dyspnea on exertion.  She has no increased lower extremity edema.  No  PND.   In the emergency room her pain has resolved.  There are new lateral T-  wave inversions noted on the 12-lead.  She was seen as recently as June  3 by Dr. Dietrich Pates.  She was found in a stable condition as regards  cardiac history.  The plan will be to admit the patient with a diagnosis  of atypical chest pain.  We will rule out myocardial infarction.  This  does not appear to be acute coronary syndrome.  The patient is a high-  risk patient, however, she is chest pain-free right now.  We will  continue current antiplatelet and medical therapy.  If she rules out she  can probably be discharged without further functional study or cardiac  catheterization.  She seems to be a little dry on exam today; Lasix will  be held and she will be given some mild hydration.  A BMP will be   checked.   HOSPITAL COURSE:  The patient was admitted to [facility]  through the emergency room August 10 with diagnosis standing at atypical  chest pain.  Her cardiac enzymes were negative x3.  Her admission BNP is  219.  The enzymes were 0.03, 0.04 then 0.03.  The patient did not have  any recurrence of her chest pain during this hospitalization.  She did  have occurrence of atrial fibrillation.  This was a coarse pattern,  flutteroid.  There was some thought that she might be having atrial  flutter recurrence.  She was seen by electrophysiology, Dr. Graciela Husbands, who  recommended that the patient would probably do best with medical therapy  for her arrhythmia.  The issue of anticoagulation in the context of  recent NSTEMI with drug-eluting stent as of March 2009 will be discussed  with this patient as an outpatient.  Once again she was having no  further chest pain.  Her blood pressure was under good control and was  130/70 at the time of discharge.  Her BNP on admission was 219.  Her TSH  this admission was 3.343.  Her atrial dysrhythmia is paroxysmal and has  been in the minority of her rhythms on telemetry, one five hour period  during the evening of August 12.  The patient was discharged on the  following medication:   DISCHARGE MEDICATIONS:  1. Lasix 20 mg daily.  2. Aspirin 325 mg daily.  3. Plavix 75 mg daily.  4. Lisinopril 20 mg daily.  5. Detrol 4 mg daily.  6. Metoprolol 50 mg twice daily.  7. Namenda 10 mg in the morning and 5 mg in the evening.  8. Norvasc 5 mg daily.  9. Synthroid 50 mcg daily.  10.Zocor 40 mg at bedtime.  11.Insulin per sliding scale.  12.Lantus 25 units daily at bedtime.   LABORATORY STUDIES:  Hemoglobin is 12.1, hematocrit 35.7, white cells  are 7, platelets are 231.  Serum electrolytes on August 12 sodium 136,  potassium 4.5, chloride 104, bicarbonate 25, BUN is 39, creatinine 1.32,  glucose is 170, magnesium was 2.7.  T3 is 32.9 and this  was within  normal limits.  T4 is 0.98 also within normal limits.  Total bilirubin  is 0.7.      Jaclyn Mirza, PA      Arturo Morton. Riley Kill, MD, [facility]  Electronically Signed    GM/MEDQ  D:  02/27/2008  T:  02/27/2008  Job:  [id number]   cc:   Gerrit Friends. Dietrich Pates, MD, [facility]  Margaretmary Bayley, M.D.

## 2010-11-29 NOTE — H&P (Signed)
[name], [name]:  [id number]   MEDICAL RECORD NO.:  [id number]          PATIENT TYPE:  INP   LOCATION:  1824                         FACILITY:  [facility]   PHYSICIAN:  Reginia Forts, MD     DATE OF BIRTH:  April 20, 1927   DATE OF ADMISSION:  11/03/2007  DATE OF DISCHARGE:                              HISTORY & PHYSICAL   CHIEF COMPLAINT:  Chest pain and confusion.   [name] is a 77 year old African American woman with a history of  diabetes, coronary artery disease status post CABG, and recent non-ST-  elevation MI with two drug-eluting stents placed in two vein grafts who  presents with recurrent chest pain.  The patient was hospitalized one  month ago for a non-ST-elevation MI and had no complications from her  intervention.  Earlier in the day, the complained of left eye pain and  headache.  Family members noted she had one episode of a convulsion and  was taken to the [facility] emergency room where she was found to be  hypoglycemic.  After D50 administration her headache resolved but she  still remained mildly confused.  She was discharged home but 1 hour  later complained of left substernal chest pressure.  Due to the mental  status changes and new chest pain, the patient was brought to [facility] for further evaluation.  Per patient, the pain is similar to  her pain during her heart attack one month ago.  In the ED, her blood  pressure was noted to be 184 systolic.  Per family, the patient's blood  pressure has never been that elevated and they state that she was  compliant with her medications today due to the family administering her  medications.  She currently is oriented to self only with a 2/10 chest  pressure.  She denies any headaches.   PAST MEDICAL HISTORY:  1. Coronary artery disease status post coronary artery bypass, unknown      date.      a.     Range is between 1990 and 1998.      b.     Non-ST-elevation MI in March  2009.  Catheterization March 25       and March 26 resulted in two drug-eluting stents, one to the       saphenous vein graft to the OM-1 and OM-2 jail graft, the other       stent to the SVG to the first diagonal.  The LIMA to the LAD graft       was patent.  2. Chronic renal insufficiency.  3. Atrial flutter status post ablation.  4. Dyslipidemia.  5. Diabetes, poorly controlled.  6. Hypothyroidism.   ALLERGIES:  PENICILLIN.   MEDICATIONS:  1. Detrol LA 4 mg p.o. daily.  2. Lisinopril 20 mg p.o. daily.  3. Synthroid 25 mcg p.o. daily.  4. Isosorbide mononitrate 60 mg p.o. daily.  5. Sliding scale insulin 75/25 10 units daily.  6. Plavix 75 mg p.o. daily.  7. Amlodipine 5 mg p.o. daily.  8. Namenda 10 mg  p.o. q.a.m., 5 mg p.o. q.p.m.  9. Metoprolol 50 mg p.o. twice a day.  10.Simvastatin 40 mg p.o. daily.  11.Aspirin 325 mg p.o. daily.   SOCIAL HISTORY:  The patient lives in [place] with her daughter.  She  is widowed and denies any alcohol, drug use or tobacco.   FAMILY HISTORY:  Negative for any premature coronary artery disease.   REVIEW OF SYSTEMS:  Notable for headache and chest pain as noted above.  Otherwise, review of systems is unobtainable.   PHYSICAL EXAMINATION:  Temperature is 96.2, pulse 70, respiratory rate  is 16, blood pressure 201/104.  GENERAL:  The patient is awake, alert to self only, and in no acute  distress.  HEENT:  Normocephalic, atraumatic.  Pupils equal and round, reactive to  light.  Extraocular movements are intact.  Bilateral cataracts.  NECK:  Shows no JVD, no carotid bruits.  CARDIOVASCULAR:  Regular rhythm, normal rate with a 1/6 systolic  ejection murmur and a 1/6 diastolic murmur radiating from the apex to  the base.  LUNGS:  Clear to auscultation bilaterally.  ABDOMEN:  Positive bowel sounds, soft, nontender, nondistended.  EXTREMITIES:  Show no cyanosis, clubbing or edema.  MUSCULOSKELETAL:  Demonstrates no joint effusions or  tenderness.  NEUROLOGIC:  Cranial nerves II-XII grossly intact.  No musculoskeletal  or sensory deficits.  SKIN:  Demonstrates no rashes.  There is mild seborrheic keratosis.  LYMPH:  Demonstrates no lymphadenopathy.   Chest x-ray is pending.  EKG demonstrates a heart rate of 66, normal  sinus rhythm with first-degree AV block and no Q waves.  Labs  demonstrate a white count of 7.2, hematocrit of 34, potassium of 5.6,  BUN of 26, creatinine of 1.2, glucose of 460.  Troponin two sets are  less than 0.05 with a CK greater than 500 and an MB of 10.   ASSESSMENT AND PLAN:  This is a 77 year old African American female with  chest pain and mental status changes and hypertensive urgency.   1. Chest pain.  The patient will be ruled out for myocardial      infarction.  Will start heparin.  2. Hypertensive urgency.  The patient will start a nitroglycerin drip      and amlodipine will be increased to 10 mg.  3. Diabetes mellitus.  The patient will start sliding scale insulin      for hyperglycemia.  4. Mental status changes.  This is likely due to the hypertensive      urgency.  We will check her urine to rule out a UTI and blood      glucose levels will be monitored every 4 hours.  If her symptoms      change for the worse, we will obtain a CT scan of the head.      Reginia Forts, MD  Electronically Signed     RA/MEDQ  D:  11/03/2007  T:  11/03/2007  Job:  [id number]

## 2010-11-29 NOTE — Discharge Summary (Signed)
[name], [name] NO.:  [id number]   MEDICAL RECORD NO.:  [id number]          PATIENT TYPE:  INP   LOCATION:  3740                         FACILITY:  [facility]   PHYSICIAN:  Dr. Tenny Craw               DATE OF BIRTH:  [birth year]   DATE OF ADMISSION:  10/07/2007  DATE OF DISCHARGE:  10/14/2007                         DISCHARGE SUMMARY - REFERRING   ADDENDUM:  This addendum is being dictated in that the patient complete  list of medications were not listed in the history and physical.  At the  time of discharge, she was given permission to include her Detrol LA 4  mg daily, quinapril 20 mg daily, Synthroid 25 mg daily, isosorbide  mononitrate 60 mg daily.  She was also asked to continue sliding scale  glucometers before every meal and at bedtime, not just twice a day.  She  was asked to write these down and to take this information to Dr.  Ophelia Charter office this week for follow up.  She was asked to continue her  sliding scale coverage, which I believe is NovoLog 75/25.  She has a  scheduled appointment to follow up with Dr. Dietrich Pates on October 23, 2007 at  9 a.m.  Her urine culture is pending at the time of this dictation and  will need to be followed up by Dr. Loleta Chance or Dr. Dietrich Pates.      Joellyn Rued, PA-C    ______________________________  Dr. Tenny Craw    EW/MEDQ  D:  10/14/2007  T:  10/14/2007  Job:  [id number]   cc:   Annia Friendly. Loleta Chance, MD  Dr. Lennart Pall. Dietrich Pates, MD, [facility]

## 2010-11-29 NOTE — Discharge Summary (Signed)
NAMEREJOICE, [name]                ACCOUNT NO.:  [id number]   MEDICAL RECORD NO.:  [id number]          PATIENT TYPE:  INP   LOCATION:  6532                         FACILITY:  [facility]   PHYSICIAN:  [name], MD, FACCDATE OF BIRTH:  Nov 24, 1926   DATE OF ADMISSION:  03/17/2008  DATE OF DISCHARGE:  03/18/2008                               DISCHARGE SUMMARY   ADDENDUM:  This is an addendum to the previously dictated discharge summary as  follows.   I confirmed with [facility] that Ms. Ahner's metoprolol  was metoprolol extended release 50 mg daily.   Because Ms. Baise is in [facility], her stress test will  be in [place], [place], on March 26, 2008, at 9:45.  She  is to have no food or drink after midnight before and no caffeine or  decaffeinated products 24 hours before.  Morning medications are to be  given with the exception of Lantus, Lasix, and sliding scale.  All other  morning medications can be given prior to transportation for the stress  test.  She has a follow-up appointment with Tereso Newcomer for Dr. [place] Bing in [place], [place], on March 30, 2008, at 10:45.  Discussion can be held at that time if she would like to transition to a  [facility] physicians.      Theodore Demark, PA-C      [name], MD, [facility]  Electronically Signed    RB/MEDQ  D:  03/18/2008  T:  03/18/2008  Job:  [id number]

## 2010-11-29 NOTE — H&P (Signed)
[name], [name]:  [id number]   MEDICAL RECORD NO.:  [id number]          PATIENT TYPE:  INP   LOCATION:  6525                         FACILITY:  [facility]   PHYSICIAN:  Beckey Rutter, MD  DATE OF BIRTH:  04-Sep-1926   DATE OF ADMISSION:  05/20/2008  DATE OF DISCHARGE:                              HISTORY & PHYSICAL   PRIMARY CARE PHYSICIAN:  Unassigned.   CHIEF COMPLAINT:  Altered mental status.   HISTORY OF PRESENT ILLNESS:  This is a 77 year old African American  female whose past medical history is significant for coronary artery  disease status post CABG and PCI, hypertension, diabetes on insulin,  hyperlipidemia, and hypothyroidism presented today to the ER because of  altered mental status.  The patient herself thinks that she is here  because of generalized weakness.  Her family members are not around for  the purpose of history taking.  In the emergency department, the patient  was found to have slight elevation in troponin and the patient actually  was seen by Cardiology Service.  No acute cardiac intervention was  needed at this time and the patient was felt to have non-ST elevation MI  and she will receive medical management.  The patient also was noticed  to have hyperglycemia with some ketosis in the urine, but there was no  evidence of acidosis.  Hospitalized Service was called for the  management of severe hyperglycemia.  The patient could not tell what  kind of medication she is receiving at home for diabetes control.   PAST MEDICAL HISTORY:  1. Significant for coronary artery disease status post 5-vessel CABG      in 1990.  She has history of PCI on a stent recently in 2009.  2. Non-ST elevation MI on September 16, 2007 status post PCI to the      saphenous graft.  3. Hypertension.  4. Atrial flutter.  5. Dyslipidemia.  6. Hypothyroidism.  7. Iron-deficiency anemia.  8. Diabetes, uncontrolled.  9. Chronic renal insufficiency with  baseline creatinine of 1.1.   MEDICATION ALLERGIES:  PENICILLIN.   MEDICATIONS:  1. Lasix 20 mg q.i.d.  2. Aspirin 325 mg daily.  3. Lisinopril 20 mg daily.  4. Detrol LA 4 mg daily.  5. Namenda 10 mg in the morning and 5 mg at night.  6. Norvasc 5 mg daily.  7. Synthroid 50 mcg daily.  8. Zocor 40 mg q.p.m.  9. Lantus 25 units subcutaneously daily.  10.Artificial tears eye drops q.i.d.  11.NovoLog sliding scale.  12.Nitro sublingual as needed.  13.Metoprolol ER 50 mg daily.   SOCIAL HISTORY:  The patient currently lives with her daughter.  My  understanding is that the patient recently discharged from [facility] facility, I suspect after the non-ST elevation MI.  No  significant ethanol, tobacco, or drug abuse.   FAMILY HISTORY:  None pertinent.   REVIEW OF SYSTEMS:  The patient currently denied any chest pain.  Twelve-  point review of system is noncontributory.  The rest as per the HPI.   PHYSICAL EXAMINATION:  VITAL  SIGNS:  Blood pressure 140/50, pulse 72,  and respiratory rate is 16.  HEENT:  Head, atraumatic and normocephalic.  Eyes, slight retraction to  light bilaterally.  Pupil are equal.  Mouth is moist.  No ulcer.  NECK:  Supple.  No JVD.  LUNGS:  Bilateral decreased air entry.  No adventitious sounds.  CARDIAC:  Precordium first and second heart sounds distally audible.  No  added sound appreciated.  ABDOMEN:  Soft and  nontender.  Bowel sounds present.  EXTREMITIES:  No lower extremities edema.  NEUROLOGIC:  She is alert and oriented x3.  Nevertheless, she could not  remember the medication she is taking when she asked specifically about  diabetes medication.   LABS AND X-RAYS:  Quantitative ketones in the blood is showing moderate  microscopic urine and urinalysis is negative for nitrate and leukocyte  esterase.  The patient has urine glucose more than 1000.  Ketones in the  urine is 15 mcg/dL.  Her ABG is showing pH of 7.482, PCO2 is 27.6, PO2  is  44.0 and seems to be venous blood as indicated in the result.  Lactic  acid in the veins is 4.5.  Lipase is 13.  Sodium is 130, potassium 5.2,  chloride 95, bicarb 19, glucose 662, BUN 76, and creatinine 2.65.  White  blood count is 12.1, hemoglobin is 11.5, hematocrit is 35.3, and  platelet count is 184.  Troponin is 0.3.  CK-MB is 9.5.  Myoglobin is  more than 500.  Chest x-ray as per radiologist's report is showing  impression of no acute finding.  Looking at the image, trachea is  central, evidence of sternotomy with some cardiomegaly.  Lungs are clear  and no evidence of pleural effusion.  EKG could not be located  currently, but as per the cardiology consultation, no EKG change.   ASSESSMENT AND PLAN:  A 77 year old with severe hyperglycemia and non-ST  elevation myocardial as evidenced by the troponin and cardiac enzymes  elevation.  The patient also has worsening kidney function with  creatinine 2.5.   PLAN:  1. The patient will be admitted to step-down unit.  2. We will continue cycling cardiac enzymes and obtain EKG every 8      hours.  Cardiac consultation was done already with the      recommendation of conservative management.  3. Severe hyperglycemia.  There is no evidence of acidosis as per the      ABG.  Nevertheless, the patient was started on glucose stabilizer      with IV insulin.  I will continue the IV insulin until the target      rate of 120-160 mg/dL achieved and then we will transition the      patient to a sliding scale.  I will start the patient on long-      acting Lantus with 25 units of Lantus stat.  I will check      hemoglobin A1c and obtain diabetic coordinator and a dietitian      consultation when the patient became stable.  The patient will be      admitted to the ICU step-down monitoring bed.  4. Hypothyroidism.  We will check TSH and we will continue the patient      on Synthroid accordingly.  5. Dementia.  The patient seems alert, oriented x3 to  me on the      examination despite of the fact that she is on Namenda.  I will  continue the patient on Namenda when she is able and stable.  6. For deep vein thrombosis prophylaxis, the patient is already on      heparin drip and anticoagulation for the non-ST elevation      myocardial management which was left for the [facility]      [facility].  7. For gastrointestinal prophylaxis, we will start Protonix.      Beckey Rutter, MD  Electronically Signed     EME/MEDQ  D:  05/20/2008  T:  05/21/2008  Job:  [id number]

## 2010-11-29 NOTE — Cardiovascular Report (Signed)
[name], [name]:  [id number]   MEDICAL RECORD NO.:  [id number]          PATIENT TYPE:  INP   LOCATION:  3742                         FACILITY:  [facility]   PHYSICIAN:  Rollene Rotunda, MD, FACCDATE OF BIRTH:  1926/08/13   DATE OF PROCEDURE:  10/09/2007  DATE OF DISCHARGE:                            CARDIAC CATHETERIZATION   PRIMARY:  Annia Friendly. Loleta Chance, M.D.   CARDIOLOGIST:  Gerrit Friends. Dietrich Pates, MD, [facility]   PROCEDURE:  Left heart catheterization/coronary arteriography.   INDICATIONS:  Evaluate patient with non-Q-wave myocardial function.  Previous bypass grafting.   PROCEDURE NOTE:  Left heart catheterization performed in the right  femoral artery.  The arthery was cannulated using an anterior wall  puncture.  A #6-French arterial sheath was inserted via the modified  Seldinger technique.  Preformed Judkins and a pigtail catheter utilized.  The patient tolerated the procedure well and left the lab in stable  condition.   RESULTS:  Hemodynamics - LV 161/1, AO 164/83.   Coronaries - the left main was normal.  The LAD had severe proximal and  mid diffuse disease.  There was mid 99% stenosis.  The distal vessel  filled via the LIMA.  It was free of high-grade disease.  There was a  diagonal which was occluded proximally and filled via vein graft.  The  circumflex in the AV groove had an ostial 100% stenosis.  There was a  very small ramus intermediate which had moderate diffuse disease.  There  was an OM-1 which was moderate in size and occluded proximally and an OM-  2 which was large and occluded proximally.  Both vessels were filled via  a sequential vein graft.  The right coronary artery is a dominant  vessel.  It was occluded proximally.  The distal vessel was seen to fill  via the vein graft.  There was a small PDA and two posterolateral's  which had diffuse nonobstructive plaque.   Grafts - a LIMA to the LAD was widely patent.  A saphenous vein graft  to  the diagonal had mid 90% stenosis in the vein graft, saphenous vein  graft to the PDA had mid 25% stenosis and diffuse luminal irregularities  but was free of high-grade disease.  Saphenous vein graft sequential to  OM1 and OM-2 had a 99% stenosis after the OM1.  This appeared to be in  the graft prior to the proximal vessel.   Left ventricle - the left ventricle was not injected secondary to renal  insufficiency.   CONCLUSION:  Severe native three-vessel coronary artery disease.  Graft  disease in the diagonal and OM disease as described.   PLAN:  The patient will have percutaneous revascularization per Dr.  Juanda Chance.  Because of her recent creatinine of 2, we are going to limit  the dye today and do the interventions tomorrow after hydration.      Rollene Rotunda, MD, [facility]  Electronically Signed     JH/MEDQ  D:  10/09/2007  T:  10/09/2007  Job:  [id number]   cc:   Annia Friendly. Loleta Chance, MD  Molly Maduro  Rosebud Poles, MD, [facility]

## 2010-11-29 NOTE — Letter (Signed)
September 28, 2008    Annia Friendly. Loleta Chance, MD  [address]  [address]  [place], [place]  [address]   RE:  FALANA, CLAGG  MRN:  [id number]  /  DOB:  01/06/1927   Dear Earvin Hansen:   Ms. Beth returns to the office following an uncomplicated  laparoscopic cholecystectomy in February.  She has now stayed in a local  rest home ([employer]).  She has gradually gained some strength  and mobility, but is still quite limited.  She has had a poor appetite,  which predated her episode of cholecystitis and continues after her  curative surgery.  She has had substantial weight loss, which has not  yet stabilized.  She has a history of depression, but denies active  symptoms.  Her family indicates that she has been engaged at [employer] and has socialized with other residents.  She notes no sleep  disturbance.  She is neither helpless nor hopeless.  She does, however,  appears somewhat withdrawn and flat in her affective responses.   CURRENT MEDICATIONS:  1. Clopidogrel 75 mg daily.  2. Levothyroxine 0.05 mg daily.  3. Metoprolol 50 mg b.i.d.  4. Namenda 10 mg b.i.d.  5. Simvastatin 40 mg daily.  6. Aspirin 81 mg daily.  7. Lisinopril 10 mg daily.  8. Humalog per sliding scale.  9. Detrol 4 mg daily.  10.Furosemide 20 mg daily.  11.Celexa 20 mg daily.  12.Diltiazem 180 mg daily.  13.Ditropan 10 mg daily.  14.Remeron 15 mg daily.  15.Omeprazole 20 mg b.i.d.  16.Glucerna t.i.d.  17.Vicodin p.r.n.   PHYSICAL EXAMINATION:  GENERAL:  On exam, thin woman, seated in a  wheelchair, in no acute distress.  VITAL SIGNS:  The weight is 112, 4 pounds less than in January and 23  pounds less than in November.  Blood pressure 110/60, heart rate 70 and  regular, respirations 12 and unlabored.  Afebrile.  HEENT:  Temporal wasting.  NECK:  No jugular venous distention.  LUNGS:  Clear.  CARDIAC:  Normal first and second heart sounds; fourth heart sound  present.  ABDOMEN:  Soft and nontender;  normal bowel sounds; no organomegaly.  EXTREMITIES:  No edema.   IMPRESSION:  Ms. Ballard is doing fine from a cardiac standpoint.  I am  concerned about her continuing anorexia and weight loss.  We will obtain  basic tests including a chemistry profile, CBC, TSH, BMP level, and  stool for hemoccult testing.  I have asked her daughters to schedule an  appointment with you for further evaluation of this and other medical  problems.  Clopidogrel has been resumed and will be continued for at  least a total course of 2 years.  I will reassess this nice woman again  in 4 months.    Sincerely,      Gerrit Friends. Dietrich Pates, MD, [facility]  Electronically Signed    RMR/MedQ  DD: 09/28/2008  DT: 09/29/2008  Job #: [id number]

## 2010-11-29 NOTE — Discharge Summary (Signed)
NAMEAURORA, RODY NO.:  [id number]   MEDICAL RECORD NO.:  [id number]          PATIENT TYPE:  INP   LOCATION:  1429                         FACILITY:  [facility]   PHYSICIAN:  Elliot Cousin, M.D.    DATE OF BIRTH:  03-15-1927   DATE OF ADMISSION:  10/20/2008  DATE OF DISCHARGE:  10/23/2008                               DISCHARGE SUMMARY   DISCHARGE DIAGNOSES:  1. Generalized weakness, etiology multifactorial including urinary      tract infection, brittle diabetes mellitus, acute renal      insufficiency and volume depletion.  2. Fungal and probable bacterial urinary tract infection.  3. Uncontrolled type 2 diabetes mellitus with one episode of      hypoglycemia.  4. Anemia, probably of chronic disease.  5. Acute renal failure, prerenal, resolved with gentle IV fluids.  6. Hypothyroidism.  7. Hypertension.  8. Paroxysmal atrial fibrillation.  9. History of coronary artery disease.   DISCHARGE MEDICATIONS:  1. Diflucan 100 mg daily for 7 more days.  2. Ceftin 250 mg b.i.d. for 5 more days.  3. Sliding scale NovoLog q.a.c. and q.h.s.  4. Lantus insulin 7 units subcu q.a.m. and q.p.m.  5. Glucerna supplement 1 can with each meal.  6. Aspirin 81 mg daily.  7. Lasix 20 mg daily only as needed for chest congestion and      peripheral edema.  8. Zocor 40 mg q.h.s.  9. Oxybutynin ER 10 mg daily.  10.Lisinopril 5 mg daily (dose decreased from 10 mg daily).  11.Cardizem CD 180 mg daily.  12.Celexa 20 mg daily.  13.Synthroid 50 mcg daily.  14.Plavix 75 mg daily.  15.Lopressor 50 mg daily.  16.Omeprazole 20 mg daily.  17.Namenda 10 mg daily.  18.Remeron 22.5 mg q.h.s.  19.Vicodin 5/500 mg every 6 hours p.r.n. pain.  20.Multivitamin with iron once daily.   DISCHARGE DISPOSITION:  The patient is currently stable and in improved  condition.  The plan is to discharge her back to [place] today.  The patient will need to have her electrolytes/renal function  panel  reassessed in 3-5 days.   CONSULTATIONS:  None.   PROCEDURES PERFORMED:  1. CT scan of the head without contrast on October 20, 2008.  The results      revealed no acute intracranial abnormality and evidence of chronic      small vessel white matter disease.  2. Chest x-ray on October 20, 2008.  The results revealed no acute      cardiopulmonary abnormalities.   HISTORY OF PRESENT ILLNESS:  The patient is an 77 year old woman with a  past medical history significant for coronary artery disease, paroxysmal  atrial fibrillation and type 2 diabetes mellitus.  She presented to the  emergency department on October 20, 2008, from [facility] skilled  nursing facility with a chief complaint of generalized weakness.  Apparently, the patient had been started on Levaquin for a urinary tract  infection.  Her capillary blood glucose had become uncontrolled as well.  She also sustained several falls but no apparent significant head  trauma.  When  she was evaluated in the emergency department, she was  noted to be afebrile.  Her initial blood pressure was 88/45, however, it  improved to 137/59 with  IV fluids in the emergency department.  The CT  scan of her head revealed no acute intracranial abnormalities.  Her  chest x-ray revealed no acute cardiopulmonary abnormalities.  Her white  blood cell count was was 6.2.  Her BUN was elevated at 41 and her  creatinine was elevated at 2.39.  Her venous glucose was elevated at  298.  Her urinalysis revealed white blood cells.  The patient was  admitted for further evaluation and management.  For additional details,  please see the dictated history and physical.   HOSPITAL COURSE:  1. Urinary tract infection.  The patient was started on gentle IV      fluids for volume repletion.  Antibiotic treatment was started      empirically with Rocephin 1 gram IV daily.  As indicated above, the      patient's urinalysis revealed leukocytes.  However, it also       revealed many bacteria and 3-6 RBCs.  A Foley catheter was placed      in the emergency department.  A urine culture was ordered and it      grew out greater than 100,000 colonies of yeast.  Therefore,      Diflucan was added for treatment.  The patient has completed a      total of 3 days of antibiotic therapy and 2 days of antifungal      therapy.  She is currently afebrile and her white blood cell count      is within normal limits.  She will be discharged on 7 more days of      Diflucan and 5 more days of Ceftin.  The Foley catheter will be      discontinued prior to hospital discharge.  2. Type 2 diabetes mellitus with one episode of hypoglycemia.  The      patient's venous glucose and capillary glucose were somewhat      brittle during the hospitalization.  Initially her capillary blood      glucose ranged from the upper 200s to the mid 300s.  She was      initially started on sliding scale NovoLog (sensitive scale) q.a.c.      and q.h.s. and once daily dosing of Lantus.  However, because of      the ongoing hyperglycemia, the sliding scale Novolog was changed to      the resistant scale.  Her capillary blood glucose control improved.      However, last night her capillary blood glucose decreased to 28.      The patient says that she felt weak otherwise she had no other      complaints.  She was treated appropriately.  Following treatment,      her capillary blood glucose improved to 132.  The sliding scale      NovoLog and Lantus dosing has been adjusted.  Her hemoglobin A1c      was noted to be 8.2.  Further treatment will be deferred to her      primary care physician.  3. Hypertension.  The patient's blood pressure remained moderately      elevated during the hospitalization after she had been hypotensive.      She was felt to be volume depleted and therefore was started on IV  fluids initially.  Once her blood pressure increased the IV fluids      were tapered downward.   She was maintained on Cardizem and      Lopressor.  However, lisinopril was initially withheld because of      the acute renal insufficiency.  Lisinopril has now been restarted,      however, at a lower dose of 5 mg daily.  Further management will be      deferred to her primary care physician.  4. Acute renal failure.  As indicated above, the patient's creatinine      was approximately 2.4 and her BUN was 41 at the time of the initial      assessment.  With volume repletion and holding the ACE inhibitor      her renal function improved.  As of yesterday her BUN was 18 and      her creatinine was 0.87.  As indicated above, the lisinopril was      restarted at a lower dose of 5 mg daily.  The patient's renal      function will need to be assessed in the next 3-5 days and on a      regular basis.  5. Paroxysmal atrial fibrillation, history of coronary artery disease      and hyperlipidema.  Cardiac enzymes were ordered during the      hospitalization and they remained within normal limits.  The      patient also has hyperlipidemia and her fasting lipid panel      revealed a total cholesterol of 119, triglycerides of 91, HDL      cholesterol of 70 and LDL cholesterol of 31.  She is treated      chronically with Zocor.  She had no complaints of chest pain during      the hospitalization.  6. Hypothyroidism.  The patient's TSH was within normal limits at      2.493.  She was maintained on Synthroid.  7. Mild deconditioning.  The patient was evaluated by the therapists.      Per their recommendation, she will be discharged back to the      skilled nursing facility for ongoing rehabilitation and care.  8. Anemia of chronic disease.  The patient's hemoglobin ranged from      8.3-8.9 during the hospitalization.  Anemia studies were ordered      and revealed a vitamin B12 of 900, ferritin of 738 and total iron      of 57.  The patient was started on a multivitamin with iron once       daily.      Elliot Cousin, M.D.  Electronically Signed     DF/MEDQ  D:  10/23/2008  T:  10/23/2008  Job:  [id number]

## 2010-11-29 NOTE — H&P (Signed)
[name], [name]:  [id number]   MEDICAL RECORD NO.:  [id number]          PATIENT TYPE:  INP   LOCATION:  3301                         FACILITY:  [facility]   PHYSICIAN:  Gerrit Friends. Dietrich Pates, MD, FACCDATE OF BIRTH:  03-11-1927   DATE OF ADMISSION:  10/07/2007  DATE OF DISCHARGE:                              HISTORY & PHYSICAL   PRIMARY CARE PHYSICIAN:  Annia Friendly. Loleta Chance, MD.  Primary cardiologist, Dr.  Dietrich Pates.   HISTORY OF PRESENT ILLNESS:  A 77 year old woman who is now 13 years  status post CABG surgery presents with a non-Q myocardial infarction.  [name] had not had any cardiopulmonary symptoms until this morning  when she developed chest discomfort while doing housework.  This is  described as upper anterior pressure radiating to the right arm.  There  was associated dyspnea, weakness and dizziness.  She rested briefly and  felt better, but subsequently felt very weak.  She had some degree of  weakness for the preceding few days.  Her chest discomfort was  moderately severe.  She does not identify anything that exacerbates nor  improves the pain.   She was recently evaluated by Dr. Margaretmary Bayley, her endocrinologist,  who has been carefully adjusting her insulin regimen.  Most recently,  she has been taking 75/25 insulin at night and Humalog based upon  capillary blood glucose values during the day.  She previously required  35 units of Lantus, but has been utilizing only 10 units of 75/25 in  recent days.   PAST MEDICAL HISTORY:  1. An episode of congestive heart failure.  2. Atrial flutter that was successfully treated with radiofrequency      ablation and dyslipidemia.  3. She recently fell without loss of consciousness and injured her      right shoulder.   MEDICATIONS:  1. Insulin.  2. Namenda 10 mg b.i.d.  3. Lisinopril 20 mg daily.  4. Metoprolol 50 mg b.i.d.  5. Furosemide 60 mg daily.  6. KCl 20 mEq daily.  7. Isosorbide dinitrate  60 mg daily.   ALLERGIES:  PENICILLIN.   SOCIAL HISTORY:  She lives in [place] with her daughter.  Retired.  Widowed.  No use of tobacco products nor significant amounts of alcohol.   FAMILY HISTORY:  No prominent coronary disease.   REVIEW OF SYSTEMS:  Notable for pneumococcal and influenza vaccine being  up-to-date.  The patient reports intermittent constipation, occasional  urinary incontinence, DJD and corrective lenses for both near and far  vision.  Appetite is poor.  She follows a regular diet.  All other  systems reviewed and are negative.   PHYSICAL EXAMINATION:  GENERAL:  A fatigued-appearing, older woman.  VITAL SIGNS:  Temperature is 97.7, heart rate 66 and regular,  respirations 20, blood pressure 115/60, O2 saturation 100% on 2 L.  HEENT:  Bilateral arcus.  EOMs full.  Normal lids and conjunctivae.  Normal oral mucosa.  NECK:  No jugular venous distention.  Normal carotid upstrokes without  bruits.  ENDOCRINE:  No thyromegaly.  HEMATOPOIETIC:  No adenopathy.  LUNGS:  Clear.  CARDIAC:  Increased intensity of first heart sound, normal second heart  sounds.  A grade 1/6 systolic murmur at the left sternal border.  ABDOMEN:  Soft and nontender.  Normal bowel sounds.  No organomegaly.  EXTREMITIES:  No edema.  Distal pulses are decreased.  SKIN:  No significant lesions.  NEUROLOGIC:  Symmetric strength and tone.  Normal cranial nerves.   LABORATORY DATA AND X-RAY FINDINGS:  Chest x-ray with postoperative  changes with no acute findings.  EKG with normal sinus rhythm, first-  degree AV block, inferolateral ischemia that is new since June 27, 2007.  Possible prior inferior myocardial infarction and delayed R-wave  progression.   Hemoglobin 11.5, normal white count, platelets of 125.  BUN of 16,  creatinine of 2.3, glucose is 341.  Total CK is 301 with an MB of 7.7  and a troponin of 3.2.   IMPRESSION/PLAN:  [name] presents with an acute coronary  syndrome  manifested by EKG abnormalities and a significant elevation in total CPK  and troponin.  This is her first manifestation of recurrent coronary  disease since CABG surgery.  The exact date of her surgery is unclear.  It is variously described as any time between 79 and 1998 in previous  notes.  In any case, she has been asymptomatic for a long period of  time.  She undoubtedly has progression of disease.  Initial treatment  will be with heparin, intravenous nitroglycerin, clopidogrel and her  usual medications.  She has a very high risk for contrast induced  nephropathy.  We will hydrate her for at least 24 hours, if she remains  stable, while holding diuretics and ACE inhibitors.  If renal function  has substantially improved, we may be able to proceed with coronary  angiography at that point.  An echocardiogram will be obtained.  Attention will be directed towards better control of diabetes.      Gerrit Friends. Dietrich Pates, MD, [facility]  Electronically Signed     RMR/MEDQ  D:  10/07/2007  T:  10/08/2007  Job:  [id number]

## 2010-11-29 NOTE — H&P (Signed)
[name], [name] NO.:  [id number]   MEDICAL RECORD NO.:  [id number]          PATIENT TYPE:  INP   LOCATION:  3707                         FACILITY:  [facility]   PHYSICIAN:  Luis Abed, MD, FACCDATE OF BIRTH:  November 11, 1926   DATE OF ADMISSION:  11/27/2007  DATE OF DISCHARGE:                              HISTORY & PHYSICAL   PRIMARY CARDIOLOGIST:  Gerrit Friends. Dietrich Pates, MD, [facility].   PRIMARY CARE PHYSICIAN:  Margaretmary Bayley, MD   REASON FOR ADMISSION:  Chest pain.   HISTORY OF PRESENT ILLNESS:  This is a 77 year old African American  female with known history of coronary artery disease, coronary artery  bypass grafting, hypertension, and diabetes, who was recently discharged  in April 2009 after undergoing evaluation for hypertensive urgency,  found to have a non-ST elevated MI in March and had a catheterization in  April admission revealing three-vessel CAD with patent stents and  grafts; the patient was brought to the emergency room today from her  residence at [facility] with complaints of increasing cough  and shortness of breath, which began likely yesterday evening worse  today with associated left sided chest discomfort.  The patient did have  some mild lower extremity edema on arrival.  The patient was given 40 mg  IV Lasix with approximately 800 cc urine output per Foley.  On  admission, her blood pressure was 147/74.  The patient's chest x-ray  reveals CHF.  We were asked to see the patient in the emergency room to  make further recommendations.   REVIEW OF SYSTEMS:  Positive for chest pain, shortness of breath, cough,  and lower extremity edema.  Otherwise, systems reviewed and are  negative.   PAST MEDICAL HISTORY:  1. CAD status post coronary artery bypass grafting in the 1990s.  2. Non-ST-elevated MI and March 2009.  3. Status post cardiac catheterization in April 2009 revealing three-      vessel CAD with patent stents and grafts.  4. Hypertension.  5. Diabetes.  6. Chronic renal insufficiency with baseline creatinine of 1.1.  7. Atrial flutter status post ablation.  8. Dyslipidemia.  9. Hypothyroidism.   PAST SURGICAL HISTORY:  1. Hysterectomy in 2008.  2. I&D of the left elbow secondary to infection.   SOCIAL HISTORY:  She lives in [facility] in  [place].  She is widowed.  She has a daughter.  She is negative for  EtOH or tobacco use.   Family history is negative for premature heart disease.   CURRENT MEDICATIONS:  1. Aspirin 325 mg once a day.  2. Detrol LA 4 mg once a day.  3. Lisinopril 20 mg daily.  4. Metoprolol 50 mg daily,  5. Namenda 10 mg q.a.m. and 5 mg nightly.  6. Nitroglycerin 0.4 mg q.p.m.  7. Norvasc 5 mg daily.  8. Plavix 75 mg daily.  9. Synthroid 25 mg daily.  10.Zocor 40 mg daily.  11.Insulin per sliding scale 70/30, 15 units q.p.m.   ALLERGIES:  To PENICILLIN.   CURRENT LABORATORIES:  Hemoglobin 10, hematocrit 30.1, white blood  cells  5.7, platelets 228.  Sodium 139, potassium 5.1, glucose 107, CO2 34, BUN  34, creatinine 1.3, glucose 97, CK 122, MB 2.5, troponin 0.05.  EKG  revealing normal sinus rhythm with a first-degree AV block with a PR  interval of 0.23, rate of 78 beats per minute.  Chest x-ray revealing  findings compatible for CHF.   PHYSICAL EXAMINATION:  VITAL SIGNS:  Blood pressure 147/74, pulse 72,  respirations 20, temperature 98.7, and O2 sat 98% on 2 L.  HEENT:  Head is normocephalic, atraumatic.  Eyes: PERRLA.  Mucous  membranes of mouth are pink and moist.  Tongue is midline.  NECK:  Supple.  There is no carotid bruits.  There is positive JVD 1+.  CARDIOVASCULAR:  Regular rate and rhythm without murmurs, rubs, or  gallops.  Pulses are 2+ without bruits.  LUNGS:  Have bibasilar crackles.  No wheezes or rhonchi.  ABDOMEN:  Soft, nontender, 2+ bowel sounds.  EXTREMITIES:  Without clubbing.  She does have some mild lower extremity  edema  right greater than left.  NEURO:  Cranial nerves II-XII are grossly intact.   IMPRESSION:  1. Acute on chronic diastolic congestive heart failure.  2. Hypertension.  3. Diabetes.  4. Coronary artery disease status post non-ST-elevated myocardial      infarction in March 2009 with cath in April 2009 revealing patent      stents and grafts.   PLAN:  The patient has been seen and examined by Dr. Willa Rough and  myself in the emergency room and found to be stable.  Her breathing  status is improved.  She has diuresed to approximately 100 mL since IV  Lasix.  Our plan will be to admit the patient and diuresis.  The patient  will probably need to be sent home on home dose of Lasix, as  she is having evidence of diastolic CHF.  This will be assessed further  throughout hospitalization.  In the interim, she will restart receiving  Lasix 40 mg p.o. beginning in the a.m.  We will check a BNP, follow-up B-  MET and CBC in a.m. and make further recommendations.      Bettey Mare. Lyman Bishop, NP      Luis Abed, MD, [facility]  Electronically Signed    KML/MEDQ  D:  11/27/2007  T:  11/28/2007  Job:  [id number]

## 2010-11-29 NOTE — Consult Note (Signed)
[name], RAETZ NO.:  [id number]   MEDICAL RECORD NO.:  [id number]          PATIENT TYPE:  INP   LOCATION:  2108                         FACILITY:  [facility]   PHYSICIAN:  Cecille Aver, M.D.DATE OF BIRTH:  18-Dec-1926   DATE OF CONSULTATION:  DATE OF DISCHARGE:                                 CONSULTATION   PRIMARY TEAM:  Critical Care Medicine.   PRIMARY CARE PHYSICIAN:  [name].   CARDIOLOGIST:  [name]. Jaclyn Pates, MD, [facility]   REASON FOR CONSULTATION:  Hyperkalemia and renal failure.   HISTORY OF PRESENT ILLNESS:  An 77 year old female admitted with altered  mental status, bradycardia, and electrolyte abnormalities.  On March 08, 2009, the patient was seen in the ED status post fall, diagnosed  with fall and contusion to right shoulder.  On March 09, 2009, the  patient was sent to the ED via EMS from nursing home with altered mental  status, heart rate was in the 30s.  The patient was electronically paced  by EMS secondary to bradycardia, also had a BP low of 72/54 in transit.  Upon evaluation in ED, EKG showed atrial fibrillation with slow  ventricular rate.  Cardiology was consulted.  Antihypertensives and rate  control meds were discontinued.  CCM was consulted for admission.  It is  unclear if the patient's hypotension was above meds for the cause of her  acute renal failure or if renal failure was the cause of altered mental  status and above this and secondary to hyperkalemia.   On review of systems, the patient is demented, therefore cannot give  accurate review of systems; however, admitted to chest discomfort and  dysuria.   PAST MEDICAL HISTORY:  1. Diabetes mellitus.  2. Paroxysmal AFib.  3. Coronary artery disease status post CABG in 1998, N-STEMI in 2009.  4. History of atrial flutter status post ablation.  5. Peripheral vascular disease.  6. GERD.  7. Dementia.  8. Hypertension.  9.  Dyslipidemia.  10.EF of 60% with moderate tricuspid regurg and mitral regurg.  11.Hypothyroidism.  12.Renal insufficiency, baseline of 1.17 in July 2010.  13.Depression.  14.Anemia of chronic disease.   HOME MEDICATIONS:  1. Aspirin 81 mg daily.  2. Ditropan 10 mg daily.  3. Cardizem CD 180 mg daily.  4. Celexa 20 mg daily.  5. Synthroid 50 mcg daily.  6. Plavix 75 mg daily.  7. Lopressor 50 mg daily.  8. Prilosec 20 mg daily.  9. Zestril 5 mg daily.  10.Iron sulfate 325 mg daily.  11.Multivitamin daily.  12.Zocor 40 mg daily.  13.Remeron 45 mg at bedtime.  14.Sliding scale insulin.  15.Namenda 10 mg daily.  16.Vicodin 5/500 q.6 h. p.r.n. pain.   ALLERGIES:  PENICILLIN.   SOCIAL HISTORY:  The patient lives in nursing home, has history of  dementia.   PHYSICAL EXAMINATION:  VITAL SIGNS:  Temperature 98.3, heart rate 53,  range 46-53, respiratory rate 14-18, 100% on 2 L, current blood pressure  138/52, weight 54.5 kg.  Total urine output 200 mL.  GENERAL:  No  acute distress.  Alert and oriented x3.  HEENT:  Moist mucous membranes.  No JVD, no carotid bruit.  EOMI.  CVS:  Bradycardia.  No murmur.  Telemetry, no peak T-waves.  RESPIRATORY:  CTAB.  ABDOMEN:  Positive bowel sounds.  Soft, nontender, nondistended.  EXTREMITIES:  No edema.   LABORATORY DATA:  ABG at 1:30 p.m. pH 7.4, CO2 of 22.6, O2 of 116,  bicarb of 14.  Cardiac enzymes, troponin 0.03 x2 sets.  MRSA negative.  Urine ketones negative.  BMET, sodium 127, potassium 7.5, chloride 100,  pCO2 of 18, BUN 47, creatinine 2.86, glucose 301, calcium 9.1.  LFTs,  alk phos 180, AST 860, ALT 269, total protein 5.4, albumin 2.7, calcium  8.0.  T. bili 0.8.  Urinalysis, specific gravity 1.017, pH 6.0, protein  100, urobilinogen 1.0, nitrites negative, leukocytes large, microscopy  many bacteria, rbc's 7-10, hyaline casts, few squamous cells.  BNP 208.  Lactate 8.6.  CBC, white count 11.7, hemoglobin 8.8, hematocrit 26.3,   platelets 259, 82% neutrophils, MCV of 93.8, 0% eosinophils.   IMAGING:  Chest x-ray on March 09, 2009, no frank CHF.  No infiltrate,  CABG clips.   ASSESSMENT AND PLAN:  An 76 year old female admitted with altered mental  status, acute renal failure, and bradycardia.  1. Acute renal failure.  Baseline creatinine 1.17 in July 2010,      potassium was 5.2 at that time at an outside facility.  Acute      failure likely multifactorial, unclear if cardiac events preceded      renal failure vice versa.  The patient on multiple medications such      as Cardizem, Lopressor, and Zestril that can cause hypotension and      hypoperfusion yielding in ATN.  Zestril also is nephrotoxic in the      setting of dehydration, decreased p.o. intake, which also cause      hyperkalemia.  I agree with IV fluid rehydration.  We will monitor      renal function closely, obstruction is ruled out, Foley was passed.      Other cause is possible urinary tract infection with Cipro, await      culture.  Of note, the patient is also at increased risk for      nephropathy with hypertension and diabetes mellitus.  2. Hyperkalemia status post calcium gluconate, insulin, bicarb, D50      x1, Kayexalate x1, and neb treatments.  We will follow up labs this      p.m., start bicarb drip to also help hyperkalemia.  Of note, there      are no peak T-waves on telemetry at this point.  We will hold on      Lasix as the patient's blood pressure is tentative.  3. Metabolic acidosis secondary to acute renal failure.  IV fluids      with bicarb per above and we will monitor. Volume status:  Blood      pressure improved with IV fluids.  Hold on Lasix to assist with      above electrolyte deficiencies.  We will consider HD if the patient      deteriorates and toxins rise to higher levels.  4. Urinary tract infection.  Agree with primary team.  We will      continue with ciprofloxacin.  Culture pending.   All other chronic problems  per primary team.  Cardiology also following,  appreciate consult.  We will follow along.  [name], MD  Electronically Signed     ______________________________  Cecille Aver, M.D.    KD/MEDQ  D:  03/09/2009  T:  03/10/2009  Job:  [id number]

## 2010-11-29 NOTE — Letter (Signed)
June 09, 2008    __________   RE:  [name], [name]  MRN:  [id number]  /  DOB:  1927-01-03   [name] returns to the office after recent admission to [facility].  The exact nature of her problem is not clear from the  discharge summary.  She presented with impaired mental status, acute  renal failure, hypoglycemia, and mildly elevated cardiac markers.  Renal  failure resolved in the hospital.  Diabetic control was reassessed and  medications adjusted.  She was seen by Cardiology, but no specific  intervention was undertaken.  She appears to have been discharged on  essentially the same medications as when she was admitted with Protonix  40 mg daily added.   Over the past 3 days, she has developed a toothache.  Her dentist is  unable to see her until tomorrow.  She apparently has had no dyspnea nor  chest pain, but cannot cooperate with assessment at this time since she  is riving in pain and crying out and begging for me to pull her tooth.   PHYSICAL EXAMINATION:  VITAL SIGNS:  The weight is 135 pounds, 5 pounds  less than in September.  Blood pressure 155/70, heart rate 70 and  regular, respirations 14.  NECK:  No jugular venous distention.  LUNGS:  Few bibasilar rales.  CARDIAC:  Normal first and second heart sounds; modest systolic murmur.  ABDOMEN:  Soft and nontender; bowel sounds present.  EXTREMITIES:  No edema.   RHYTHM STRIP:  Normal sinus rhythm; IVCD.   IMPRESSION:  [name] appears to be stable with respect to her  cardiac problems.  She did have some paroxysmal atrial fibrillation in  hospital, but was not thought to be a candidate for  Coumadin.  She is in sinus rhythm now.  She had moderate anemia with a  hemoglobin of 10.  We will check a chemistry profile, BNP level, and  CBC.  I have written her prescription for Percocet 5/325 one-two q.6 h.  until she can see the dentist.  I will plan to see this nice woman again  in 6 weeks.    Sincerely,      Gerrit Friends. Dietrich Pates, MD, [facility]  Electronically Signed    RMR/MedQ  DD: 06/09/2008  DT: 06/09/2008  Job #: [id number]

## 2010-11-29 NOTE — Cardiovascular Report (Signed)
[name], [name]                ACCOUNT NO.:  [id number]   MEDICAL RECORD NO.:  [id number]          PATIENT TYPE:  INP   LOCATION:  3740                         FACILITY:  [facility]   PHYSICIAN:  Everardo Beals. Juanda Chance, MD, FACCDATE OF BIRTH:  1927/03/31   DATE OF PROCEDURE:  10/10/2007  DATE OF DISCHARGE:  10/14/2007                            CARDIAC CATHETERIZATION   CLINICAL HISTORY:  [name] is 77 years old and was admitted with a  non-ST-elevation myocardial infarction.  She underwent catheterization  by Dr. Antoine Poche and was found to have severe two-vessel native disease  and significant graft disease.  She had prior bypass surgery.  We made a  decision to intervene on the vein graft to posterolateral branch of the  circumflex artery and the saphenous vein graft to diagonal branch of the  LAD.   PROCEDURE:  The procedure was performed with the right femoral artery  and AL1 6-French guiding catheter with side holes.  We first approached  the saphenous vein graft to the posterolateral branch.  We did not use  distal protection.  We predilated with a 2.25 x 15-mm Maverick, and then  inflated 2.25 x 16-mm TAXUS Atom stent and postdilated with a 2.5 x 12-  mm Quantum Maverick.   We then approached the vein graft to the diagonal branch of the LAD.  We  used a FilterWire EZ small and deployed the filter and then direct  stented with a 2.5 x 15-mm PROMUS and postdilated with a 2.5 x 12-mm  Quantum Maverick.  The right femoral artery was closed with Angio-Seal.  The patient tolerated the procedure well and left the laboratory in  satisfactory condition.   RESULTS:  Initially, the stenosis in the saphenous vein graft to the  posterolateral branch of the circumflex artery and the distal limb was  95, and following stenting, this improved to 0%.   Initially, the stenosis in the vein graft to the diagonal branch of the  LAD was 80%, and following stenting, this improved to 0%.   CONCLUSION:  1. Successful stenting of the distal limb of the saphenous vein graft      to the marginal and posterolateral branch of the circumflex artery      with improvement in center narrowing from 95% to 0% using a drug-      eluting stent.  2. Successful PCI of the lesion in the saphenous vein graft to the      diagonal branch of the LAD using distal protection with the      FilterWire EZ and a PROMUS drug-eluting stent with improvement in      center narrowing from 80% to 0%.   DISPOSITION:  The patient returned to post angio room for further  observation.      Bruce Elvera Lennox Juanda Chance, MD, [facility]  Electronically Signed     BRB/MEDQ  D:  12/09/2007  T:  12/10/2007  Job:  [id number]

## 2010-11-29 NOTE — Discharge Summary (Signed)
NAMESHUNA, TABOR NO.:  [id number]   MEDICAL RECORD NO.:  [id number]          PATIENT TYPE:  INP   LOCATION:  4732                         FACILITY:  [facility]   PHYSICIAN:  Herbie Saxon, MDDATE OF BIRTH:  April 13, 1927   DATE OF ADMISSION:  05/20/2008  DATE OF DISCHARGE:                               DISCHARGE SUMMARY   INTERIM DISCHARGE SUMMARY   DISCHARGE MEDICATIONS:  Will be dictated on final discharge.   DISCHARGE DIAGNOSES:  1. Non-ST elevated myocardial infarction.  2. History of coronary artery disease.  3. Status post coronary artery bypass graft.  4. Dementia.  5. Diabetes.  6. Hypoglycemic episodes.  7. Acute-on-chronic renal insufficiency, improved.  8. Mild fluid overload improved.  9. Paroxysmal atrial fibrillation with controlled ventricular rate.  10.Urinary tract infection, improved.  11.Hypothyroid.  12.Moderate-to-severe mitral regurgitation.  13.Leukocytosis resolved.  14.Anemia of chronic disease.  15.Hyperkalemia, resolved.  16.Hypertension, stable.  17.Diabetic ketoacidosis, resolved.  Note that she has chronic diastolic congestive heart failure.   CONSULTS:  Luis Abed, MD, [facility], Cardiology.   RADIOLOGY:  The chest x-ray on May 20, 2008, showed no acute  findings.  Chest x-ray today is pending.   HOSPITAL COURSE:  This 77 year old African American lady was admitted on  May 20, 2008, with confusion.  She was extremely weak and at  presentation was noted to have hyperglycemia with ketosis evident in the  urine.  Cardiac enzymes elevated at presentation, troponin of 0.50.  Her  creatinine was also elevated with a creatinine of 2.5 and BUN is 76 at  presentation.  The patient was started on IV insulin which was later  down graded to subcutaneous insulin.  She was continued on steroids,  admitted to Step Down Unit.  Cardiology was consulted and elected for  medical treatment of the acute coronary event.  I  recommended continuing  with the statin, also recommended continuing with aspirin and Plavix.  The patient was noticed to have an elevated heparin level on May 21, 2008, with minimal hemoptysis.  Heparin is to be held,will be restarted  at a lower dose if no acute bleeding episode.  The patient has been  chest pain free.  Family wanting home health discharge plan.  The  2-D  echocardiogram at this time showed ejection fraction of 65%, mitral  regurgitation and tricuspid regurgitation, elevated filling pressures  and aortic valve sclerosis.  Cardiology has evaluated.  She is not a  Coumadin candidate.  The patient had an episode of hyperkalemia on  May 24, 2008, which was evaluated, treated with IV sodium  bicarbonate and insulin.  EKG did not show any acute abnormalities.  Amlodipine dose needed to be increased upwards to10mg  daily on May 24, 2008, to optimize the blood pressure control.  The patient was  noticed to have fluid overload on May 25, 2008, needed to be on  Lasix 40 mg p.o.  This dose has been maintained as per Cardiology.  On  the night of May 25, 2008,  she had 2 acute hypoglycemic episodes  and insulin dose has  been reduced downward.  She is to be monitored  closely for her hypoglycemic symptoms and she is to be on sliding scale  insulin coverage.  Possible discharge in the next 1-2 days if no  hypoglycemic episodes.  She had been on Cipro for 3 days, and this has  been discontinued for  Urinary tract infection which has improved.   PHYSICAL EXAMINATION:  GENERAL:  The patient is an elderly lady not in  acute distress.  VITAL SIGNS:  Temperature 98, pulse 72, respiratory rate 22, and blood  pressure 133/87.  HEENT:  She is alert and oriented x3.  Mild dementia.  Pupils equal,  reactive to light and accommodation.  Head is atraumatic, normocephalic.  Oral mucosa is moist.  NECK:  Supple.  No elevated JVD.  CHEST: Clear.  HEART:  Heart sounds S1 and  S2 regular.  ABDOMEN:  Benign.  Peripheral pulses present.  No pedal edema.   LABORATORY DATA:  BUN is 13, creatinine 1.3, cardiac BNP is 355, and  glucose of 95.  She is have a chest x-ray today to be followed up.  I  will review the BMP in the morning.      Herbie Saxon, MD  Electronically Signed     MIO/MEDQ  D:  05/26/2008  T:  05/27/2008  Job:  [id number]

## 2010-11-29 NOTE — Discharge Summary (Signed)
[name], [name]                ACCOUNT NO.:  [id number]   MEDICAL RECORD NO.:  [id number]          PATIENT TYPE:  INP   LOCATION:  3707                         FACILITY:  [facility]   PHYSICIAN:  [name], MD, FACCDATE OF BIRTH:  11-29-1926   DATE OF ADMISSION:  11/27/2007  DATE OF DISCHARGE:  11/29/2007                               DISCHARGE SUMMARY   PRIMARY CARDIOLOGIST:  Gerrit Friends. Dietrich Pates, MD, F.A.C.C.   PRIMARY CARE [place]rick:  Margaretmary Bayley, M.D.   DISCHARGE DIAGNOSES:  Acute diastolic congestive heart failure/dyspnea.   SECONDARY DIAGNOSES:  1. Coronary artery disease, status post coronary artery bypass      grafting in the 1990s with catheterization, April 2009, revealing      patent grafts and native multivessel disease.  2. Hypertension.  3. Type 2 diabetes mellitus.  4. Chronic kidney disease.  5. History of atrial flutter, status post ablation.  6. Hyperlipidemia.  7. Hypothyroidism.   ALLERGIES:  PENICILLIN.   PROCEDURES:  None.   HISTORY OF PRESENT ILLNESS:  Patient is a 77 year old African-American  female with prior history of coronary artery disease, status post recent  non-ST-elevation MI in March 2009 with catheterization in April 2009,  revealing three-vessel CAD with patent stents and grafts.  She was in  her usual state of health until Nov 26, 2007, when she began to  experience dyspnea with cough and mild lower extremity edema, as well as  mild chest discomfort.  She presented to [facility] ED, where she  was treated with intravenous Lasix with good urine output and some  improvement in symptoms.  Chest x-ray did show CHF.  She was admitted  for management of acute diastolic congestive heart failure.   HOSPITAL COURSE:  Patient had elevation in troponin to 0.16 with normal  CK and MB.  Her troponin has since trended down.  She was maintained on  intravenous Lasix with good diuresis and clinical improvement.  We have  subsequently  transitioned her to low-dose oral Lasix.  The plan will be  for discharge to [facility] today.   Of note, the patient's TSH on admission was 8.156.  We have increased  her Synthroid dose from 25 mcg daily to 50 mcg daily.  We recommend she  follow up with Dr. Chestine Spore for further monitoring of her thyroid function.   DISCHARGE LABS:  Hemoglobin 10.5, hematocrit 31.1, WBC 5.9, platelets  231.  Sodium 134, potassium 4.8, chloride 98, CO2 29, BUN 32, creatinine  1.36, glucose 388, calcium 9.2.  CK 77, MB 2.2, troponin I 0.07.  BNP on  admission was 332, TSH 8.156.  Urinalysis was negative.   DISPOSITION:  Patient is being discharged today in good condition.   FOLLOWUP PLANS AND APPOINTMENTS:  We have arranged for followup with Dr.  Dietrich Pates on June 3 at 1:15 p.m.  She was asked to follow up with Dr.  Chestine Spore as previously scheduled and will require repeat thyroid function  testing in four to six weeks.   DISCHARGE MEDICATIONS:  1. Lasix 20 mg daily.  2. Aspirin 325  mg daily.  3. Plavix 75 mg daily.  4. Lisinopril 20 mg daily.  5. Detrol 4 mg daily.  6. Metoprolol tartrate 50 mg b.i.d.  7. Namenda 10 mg daily, 5 mg q.p.m.  8. Nitroglycerin 0.4 mg sublingual p.r.n. chest pain.  9. Norvasc 5 mg daily.  10.Synthroid 50 mcg daily.  11.Zocor 40 mg q.h.s.  12.70/30 insulin and sliding scale insulin as previously prescribed.   OUTSTANDING LABS:  None.   DURATION OF DISCHARGE ENCOUNTER:  Sixty minutes, including physician  time.      Nicolasa Ducking, ANP      [name], MD, [facility]  Electronically Signed    CB/MEDQ  D:  11/29/2007  T:  11/29/2007  Job:  [id number]   cc:   Margaretmary Bayley, M.D.

## 2010-11-29 NOTE — Discharge Summary (Signed)
[name], [name] NO.:  [id number]   MEDICAL RECORD NO.:  [id number]          PATIENT TYPE:  INP   LOCATION:  5526                         FACILITY:  [facility]   PHYSICIAN:  Charlestine Massed, MDDATE OF BIRTH:  Dec 27, 1926   DATE OF ADMISSION:  06/25/2008  DATE OF DISCHARGE:                               DISCHARGE SUMMARY   INTERIM DISCHARGE SUMMARY   PRIMARY CARE DOCTOR:  [name], M.D.   PRIMARY CARDIOLOGIST:  Jaclyn Friends. Dietrich Pates, MD, [facility].   PRIMARY SURGEON:  [facility], Dr. Zachery [name] and Dr.  Ezzard [name].   REASON FOR ADMISSION:  On Sunday night, patient, [name], a 5-  year-old female who was admitted for abdominal pain, was admitted for  complaints of high blood sugar and some abdominal pain.   HOSPITAL COURSE:  A 77 year old female, [name], who has prior  history of coronary artery disease, status post coronary artery bypass  surgery, hypertension, dyslipidemia, dementia, and status post cardiac  stenting in 2009 on Plavix, was admitted for increased blood sugar and  blood pressure.  She was discharged just before this current admission  after being treated for diabetic ketoacidosis and non-ST elevation MI  and had a stent placed.  She was treated for the diabetes and after she  was treated for the diabetes, and she was found to have UTI and  persistent abdominal pain for which a CAT scan of the abdomen was done,  which showed features suggestive of acute cholecystitis and  cholelithiasis.  The patient was also having a dilated gallbladder and  common bile duct.   Patient was on Plavix, so Plavix was held as per the instructions of  surgery.  Then patient had a percutaneous cholecystostomy placed by IR,  and the cholecystostomy drain was left in to be there for 4-6 weeks and  surgery has advised elective cholecystectomy as outpatient after  discharge.  Cholecystostomy to be in place for 4-6 weeks.   In view  of this and the developments, the patient was considered for  skilled nursing facility placement.  The social worker mentioned that  the patient has to get a Medicaid-only nursing home, so she is still  awaiting skilled nursing facility placement.   During this last one week, she has had some p.o. intake.  The p.o.  intake has come down considerable, and she had multiple glycemic  episodes, following which her Lantus was started, and she has continued  only on NovoLog coverage.  She was also started on Celexa in view of the  patient having a depressed mood.  Pain in the abdomen was well  controlled with pain medication.   After discussion with the social worker, it was found that her stay may  be prolonged due to the inability to place her in a skilled nursing  facility due to insurance restrictions.  So I called the [facility] team to re-evaluate her for possible cholecystectomy  during this admission, as patient is staying in the hospital for a long  time.  Now, I have to still get  my information back from them with  regards to her surgery.  Currently, the patient is still on Plavix and  depending on the further decisions, will make adjustments to  medications.   SUMMARY:  So in summary, the following conditions, patient has the  following conditions:  1. Uncontrolled diabetes.  She was put on Lantus and currently her      intake has come down.  She has had further hypoglycemic episodes,      so Lantus has been stopped, being continued on NovoLog coverage.      Once her intake increases, we can restart Lantus.  2. Sepsis secondary with E. coli and E. coli bacteremia with E. coli      pyelonephritis and cholecystitis.  Patient was continued on      ceftriaxone for 14 days, after which her blood cultures turned out      to be negative.  She has sepsis, and bacteremia is currently      resolved.  She is afebrile.  Antibiotics have been stopped after 14      days.  3.  E. coli pyelonephritis:  Patient has completed ceftriaxone.  She      has no further symptoms of pyelonephritis.  Sonogram does not      reveal any complications with the urinary tract structure.  4. Acute cholecystitis:  A cholecystostomy drain has been placed      percutaneously, and patient still has the drain in place.  As per      surgery, she has to stay for 4-6 weeks and then to follow up with      surgery as outpatient for elective cholecystectomy, but in view of      her prolonged stay in the hospital, called surgery team to re-      evaluate the decision about whether she can undergo cholecystectomy      during this admission.  5. Paroxysmal atrial fibrillation:  Patient is currently on p.o.      Cardizem with which her rate is controlled.  She cannot be on      Coumadin due to the presence history of dementia and fall, and      currently due to the presence of ongoing surgery.  6. Coronary artery disease:  Blood pressures are controlled, and she      has been started on Plavix, continuing aspirin and Plavix.  A      cardiology consult was done during this admission.   CURRENT MEDICATIONS:  1. Aspirin 800 mg daily.  2. Norvasc 10 mg daily.  3. Celexa 10 mg daily.  4. Plavix 75 mg daily.  5. Cardizem CD 120 mg p.o. daily.  6. Lasix 20 mg p.o. daily.  7. NovoLog with coverage with each meal.  8. Synthroid 50 mcg daily.  9. Lisinopril 20 mg p.o. daily.  10.Lopressor 50 mg p.o. b.i.d.  11.Protonix 40 mg p.o. daily.  12.Namenda 10 mg p.o. daily.  13.Zocor 40 mg p.o. daily.  14.Detrol 4 mg p.o. daily.  15.Vicodin p.r.n. pain.   The final list of medications will be dictated at the time of discharge.   CONSULTATIONS DONE DURING THIS ADMISSION:  1. Surgery consult by [facility] team, [name].      [name].  2. Interventional radiology consult for cholecystostomy, [name].  3. Cardiology, [name].   CODE STATUS:  Code status has been  discussed with the daughter, [name], many times, and currently patient is  a full code at this time  and no further updates have been issued from the family.   DISPOSITION:  Patient is waiting for a skilled nursing facility bed and  to be discharged.      Charlestine Massed, MD  Electronically Signed     UT/MEDQ  D:  07/21/2008  T:  07/21/2008  Job:  [id number]   cc:   Sandria Bales. [name], M.D.  Jaclyn Friends. Dietrich Pates, MD, [facility]  Jaclyn Friendly. Loleta Chance, MD

## 2010-12-02 NOTE — Group Therapy Note (Signed)
[name], [name]                ACCOUNT NO.:  [id number]   MEDICAL RECORD NO.:  [id number]          PATIENT TYPE:  INP   LOCATION:  A203                          FACILITY:  [facility]   PHYSICIAN:  Angus G. Renard Matter, MD   DATE OF BIRTH:  September 20, 1926   DATE OF PROCEDURE:  05/04/2006  DATE OF DISCHARGE:                                   PROGRESS NOTE   This patient continues to have pain in the left elbow.  She has septic  bursitis and is being treated by Dr. Hilda Lias.  She did have I&D of the  elbow.  Remains on IV antibiotics.  Remains on sliding scale Humalog insulin  and Lantus insulin.   OBJECTIVE:  VITAL SIGNS:  Blood pressure 140/73, respirations 24, pulse 75,  temperature 98.  Blood sugars have ranged from a low of 57 to a high of 194.  Hemoglobin 9.9, hematocrit of 29.5.  Cardiac enzymes remain within normal  limits.  HEART:  Regular rhythm.  LUNGS:  Clear to P&A.  ABDOMEN:  No palpable organs or masses.  The patient has left elbow tenderness and swelling with dressing applied.   ASSESSMENT:  The patient does have brittle diabetes.  Has had some recent  chest pain which does not appear to be of cardiac origin.  She is being  treated for septic bursitis of the left elbow which has been I&D.  She  remains on IV antibiotics.   PLAN:  To continue regimen.      Angus G. Renard Matter, MD  Electronically Signed     AGM/MEDQ  D:  05/04/2006  T:  05/05/2006  Job:  [id number]

## 2010-12-02 NOTE — H&P (Signed)
[name], [name]:  [id number]   MEDICAL RECORD NO.:  [id number]          PATIENT TYPE:  INP   LOCATION:  A203                          FACILITY:  [facility]   PHYSICIAN:  Catalina Pizza, M.D.        DATE OF BIRTH:  1926-11-11   DATE OF ADMISSION:  04/28/2006  DATE OF DISCHARGE:  LH                                HISTORY & PHYSICAL   PRIMARY CARE PHYSICIAN:  Angus G. Renard Matter, M.D.   HISTORY OF PRESENT ILLNESS:  Ms. Dorsi is a 77 year old African female  with a history of hypertensive heart disease and mitral regurgitation and  history of atrial flutter before who is status post CABG x4 in 1998(?), who,  for the past week or two, has been complaining of left elbow pain.  Apparently, she has been treated for what was thought to be gouty arthritis  and started on a steroid dose pack, but came in last evening with an open  draining left elbow of purulent material.  She does not have any other  specific complaints at this time, but it is noted that her appetite has  decreased over the last week, as well as her sugars have been more elevated.  She presented initially with blood sugar in the 600 range in the emergency  department and was started on an insulin drip which brought it down rapidly.  She was also started on broad-spectrum antibiotics.  Attempts were made to  get orthopedics to see her last night, but will do so today.   PAST MEDICAL HISTORY:  1. Diabetes mellitus type 2, insulin dependent.  2. Coronary artery disease status post CABG x4.  3. Unknown other history, whether hypertension or hyperlipidemia.   REVIEW OF SYSTEMS:  The patient denies any significant fever.  Does have  generalized weakness and some mild nausea and decreased appetite,  significant pain in her elbow and does mention some mild pain in her left  ankle occasionally.  Denies any swelling or erythema in any other joint.  All other review of systems is negative.   MEDICATIONS:  Unclear  of her medicines at this time.  Apparently, she is on  Lantus 35-40 units.  She was on some type of Medrol dose pack; on her last  day today of that.  Unclear of her other blood pressure medicines or  diabetes medicines.  Daughter is supposed to bring them up to the hospital.   OBJECTIVE:  VITAL SIGNS:  Temperature is 98.0, blood pressure 111/61, pulse  78, respirations 12.  CBGs have trended down from 316, 295, 224, 84, 174  respectively.  GENERAL:  This is an elderly African-American female lying in bed in no  acute distress.  HEENT:  Pupils equal and react to light and accommodation.  Oropharynx is  clear.  Mucous membranes are moist.  NECK:  No JVD or thyromegaly.  HEART:  Regular rate and rhythm.  There is a 1/6 systolic murmur best  appreciated at the left upper sternal border.  LUNGS:  Clear to auscultation bilaterally.  ABDOMEN:  Mildly protuberant but  positive bowel sounds.  Nontender.  No  masses appreciated.  NEUROLOGIC:  The patient is alert and oriented x3, but does not remember her  medicines at this time.  MUSCULOSKELETAL:  The patient has a large left elbow with blistering of her  skin around this.  She does have several open spots around the elbow which  are draining yellow to green purulent material.  It does feel that she has  tracking down the lateral aspect of her forearm.   LABORATORY WORK:  Lab work obtained on hospitalization - CBC showed a white  count of 13.0, hemoglobin 11.2, platelet count 461.  BMET initially showed  sodium 130, potassium 5.5, chloride 91, CO2 of 31, glucose 607, BUN 52,  creatinine 1.5, calcium 9.2.  Culture of this wound is still pending.  UA  shows moderate leukocytes, small blood, greater than 1000 glucose.  Urine  micro shows many bacteria.  BMET repeated this morning shows sodium 136,  potassium 4.3, chloride 100, CO2 of 30, glucose 152, BUN 44, creatinine 1.3,  calcium 8.8.  No x-rays were obtained.   IMPRESSION:  This is a  77 year old African-American female with what appears  to be septic bursitis, question septic arthritis in the left elbow, and she  does have significant diabetes, as well.   ASSESSMENT/PLAN:  1. Septic arthritis, septic bursitis.  Will get orthopedic consult for      this.  She is started on broad-spectrum antibiotics, covering for MRSA      with vancomycin and may need to broaden coverage further per      orthopedic's assessment.  Will treat with pain medicines routinely.  2. Diabetes mellitus type 2, insulin dependent.  Unclear of her last      hemoglobin A1C, but, given she came in with a sugar of 600, may be      secondary to steroids and be due to the infection.  She was start on      insulin drip which brought it down immediately and was taken off this      and covered with sliding scale, but will add a low dose Lantus for      basal insulin coverage.  3. Coronary artery disease status post CABG.  Will assess all of her      medicines and see exactly which medicines she is on for this and resume      them appropriately.   DISPOSITION:  The patient will be seen and assessed by orthopedics today and  will monitor closely, given the significant nature of this infection.      Catalina Pizza, M.D.  Electronically Signed     ZH/MEDQ  D:  04/28/2006  T:  04/29/2006  Job:  [id number]

## 2010-12-02 NOTE — Consult Note (Signed)
[name], [name]:  [id number]   MEDICAL RECORD NO.:  [id number]          PATIENT TYPE:  INP   LOCATION:  A203                          FACILITY:  [facility]   PHYSICIAN:  Gerrit Friends. Dietrich Pates, MD, FACCDATE OF BIRTH:  1927/03/03   DATE OF CONSULTATION:  05/04/2006  DATE OF DISCHARGE:                                   CONSULTATION   REFERRING PHYSICIAN:  Dr. Renard Matter.   PRIMARY CARDIOLOGIST:  Formerly Dr. Dorethea Clan.   HISTORY OF PRESENT ILLNESS:  A 77 year old woman referred for evaluation of  chest discomfort and abnormal cardiac markers.  [name]'s history of  coronary disease dates to at least 31 when she was found have severe three-  vessel disease and underwent uncomplicated CABG surgery.  She has done well  since, but has had intermittent chest discomfort.  A stress nuclear study  was most recently performed in April at which time she had a equivocal  inferior wall findings.  Overall, the study was considered to be of low  risk.   The patient was admitted for septic olecranon bursitis on the left.  She has  been afebrile.  She has been treated with antibiotics with improvement in  discomfort.  Over the past 2 days, she has reported intermittent chest  aching, not unlike previous episodes. There has been no with associated  dyspnea, diaphoresis nor nausea.  There has been no radiation.  Episodes  have been fairly brief and promptly relieved with nitroglycerin.  Serial  EKGs have been obtained - it is not clear whether any were recorded during  an episode. Initial tracings were normal.  Subsequent tracings are really  normal, but there is a marked diminution in T-wave voltage.   Cardiac markers over the past 24 hours started with normal troponin, normal  total CPK and borderline MB.  There has been no substantive change in CPK or  MB, but troponins have become slightly positive.  At the present time the  patient feels fine.   Contributors to her  vascular disease include diabetes requiring insulin  therapy, hypertension and dyslipidemia.  She has a history of atrial flutter  for which she underwent ablation, which has been apparently successful.  A  left atrial thrombus was visualized in the past while her atrial arrhythmia  was present.  There is also a vague history of congestive heart failure.  She has peripheral vascular disease as well, having undergone percutaneous  intervention for a right superficial femoral artery stenosis.   PAST MEDICAL HISTORY:  Otherwise unremarkable.   ALLERGIES:  PENICILLIN.   MEDICATIONS:  1. Detrol LA.  2. Lovastatin 10 mg daily.  3. Metoprolol 50 mg b.i.d.  4. Ativan 0.5 mg daily.  5. Furosemide (dose uncertain) daily.  6. Isosorbide mononitrate 90 mg daily.  7. Levothyroxine 0.025 mg daily.  8. Lisinopril 20 mg daily.  9. Aspirin 81 mg daily.   SOCIAL HISTORY:  No use of tobacco products; no excessive alcohol.   FAMILY HISTORY:  No prominent coronary disease.   REVIEW OF SYSTEMS:  Notable for vaccinations that are up-to-date,  intermittent constipation, occasional urinary incontinence, DJD, the  requirement for corrective lenses for both near and far vision.  Appetite is  poor.  All other systems reviewed and are negative.   PHYSICAL EXAMINATION:  GENERAL:  Pleasant woman in no acute distress.  VITAL SIGNS:  The blood pressure is 135/60, heart rate 85 and regular,  respirations 20, temperature 99.2.  HEENT:  Anicteric sclerae; normal lids and conjunctiva.  Normal oral mucosa.  NECK:  Moderate jugular venous distension; normal carotid upstrokes without  bruits.  LUNGS:  Few coarse breath sounds at the left base.  CARDIAC:  Normal first and second heart sounds; no third heart sound  present; grade 2-3/6 mid and late systolic murmur at the lower left sternal  border.  ABDOMEN:  Soft and nontender; normal bowel sounds; no masses; no  organomegaly.  EXTREMITIES:  1+ pretibial edema;  distal pulses intact.  SKIN:  No significant lesions.  ENDOCRINE:  No thyromegaly.  HEMATOPOIETIC:  No adenopathy.  PSYCHIATRIC:  Alert and oriented; normal affect.   EKG:  Normal sinus rhythm within normal limits.  Comparison with more recent  tracings compared to admission tracing show flattening of the T-waves.   Additional laboratory notable for mild leukocytosis on admission, mild  anemia with normal MCV, initially markedly elevated glucose with  hyponatremia, both of which have corrected and low iron with low iron  binding and a low normal saturation.   IMPRESSION:  [name] presents with a clearly a noncardiac illness.  She  has had chest discomfort in hospital that is not much different than  symptoms that have frequently been seen in the past.  There are no  diagnostic EKG abnormalities; however, her cardiac markers are definitely of  concern.  Low-molecular-weight heparin will be added to her medical regime.  If she has recurrent chest discomfort, IV nitroglycerin will be added as  well.  We will continue to obtain serial markers and reassess her over the  weekend.  She may require coronary angiography next week.   [name] has a fairly impressive regurgitant murmur.  A previous  echocardiogram revealed both moderate tricuspid and mitral regurgitation.  A  repeat study will be obtained to further evaluate her valvular heart  disease.   Diabetic control is improved.  This should be maintained.  We will assess  her lipids.  A BNP level and thyroid function studies will also be obtained.  We greatly appreciate the request for consultation and will be happy to  follow this woman with you.      Gerrit Friends. Dietrich Pates, MD, [facility]  Electronically Signed     RMR/MEDQ  D:  05/04/2006  T:  05/05/2006  Job:  [id number]

## 2010-12-02 NOTE — Op Note (Signed)
[name], [name]                ACCOUNT NO.:  [id number]   MEDICAL RECORD NO.:  [id number]          PATIENT TYPE:  INP   LOCATION:  A203                          FACILITY:  [facility]   PHYSICIAN:  J. Darreld Mclean, M.D. DATE OF BIRTH:  [birth year]   DATE OF PROCEDURE:  04/30/2006  DATE OF DISCHARGE:                                 OPERATIVE REPORT   PREOPERATIVE DIAGNOSIS:  Infection left elbow area with purulent drainage.   POSTOPERATIVE DIAGNOSIS:  Infection left elbow area with purulent drainage.   PROCEDURE:  I&D left elbow area infection.  Elbow itself is not infected.  It is superficial and under the skin infection, rather large from the  olecranon area extending up the mid forearm from the ulnar aspect.   ANESTHESIA:  General anesthesia.   SURGEON:  J. Darreld Mclean, M.D.   Patient had the wound left open and packed with fine mesh Betadine saturated  gauze.   INDICATIONS FOR PROCEDURE:  Patient has had a fever, pain and tenderness in  the left elbow.  She was placed on vancomycin.  She was growing Staph  aureus.  The redness is down but she is still draining a significant amount  of purulent material.  I felt it was best to do a formal I&D and debridement  to facilitate treatment with antibiotics.  I have explained the risks and  imponderables to the patient.  She appears to understand and agrees to the  procedure as outlined.   DESCRIPTION OF PROCEDURE:  Patient seen in the holding area.  She identified  the left arm as correct surgical site.  I placed a mark on the left elbow  and she placed a mark on the left elbow.  She was brought back to the  operating room.  She was given general anesthesia while supine on the  operating room table.  She was prepped and draped in the usual manner.  Patient had obvious purulent drainage coming from the elbow area.  We had a  time-out reidentifying the patient and the left elbow as the correct  surgical site.  After she was prepped  and draped, I made a stab wound and  drained a copious amount of purulent material.  Then I used a Water-Pik  lavage system and used three liters of saline and irrigated this area.  I  obtained a culture and sensitivity and then a fine mesh gauze saturated with  Betadine was then packed into the wound.  Then I put a bulky dressing over  this, sterile dressing over that  ABDs and an Ace bandage loosely.  She tolerated the procedure well.  I will  bring her back to surgery on Wednesday.  This was the first part of a stage  procedure.   She tolerated the procedure well and went to recovery in good condition.           ______________________________  [name]. Darreld Mclean, M.D.     JWK/MEDQ  D:  04/30/2006  T:  05/01/2006  Job:  [id number]

## 2010-12-02 NOTE — Group Therapy Note (Signed)
NAMEHILLARI, ZUMWALT                ACCOUNT NO.:  [id number]   MEDICAL RECORD NO.:  [id number]           PATIENT TYPE:  INP   LOCATION:  A203                          FACILITY:  [facility]   PHYSICIAN:  Angus G. Renard Matter, MD   DATE OF BIRTH:  August 12, 1926   DATE OF PROCEDURE:  05/12/2006  DATE OF DISCHARGE:                                   PROGRESS NOTE   SUBJECTIVE:  This patient is being treated for set septic bursitis of the  left elbow.  She remains on IV antibiotics.  She is an insulin-dependent  diabetic and has CHF.  She has improved.  He still receiving pulse lavage  and debridement.  Elbow has minimal drainage.   OBJECTIVE:  VITAL SIGNS:  Blood pressure 146/72, respirations 24, pulse 81,  temperature 98.8.  Blood sugars ranged from 65-299.  HEART:  Regular rhythm.  LUNGS:  Clear.  ABDOMEN:  No palpable organs or masses.  EXTREMITIES:  The patient does have tenderness in and around her left elbow.  There is a dressing on the left elbow.   ASSESSMENT:  Same.  See above.   PLAN:  To continue current regimen.  Continue current IV antibiotics.  Will  attempt to get patient discharged early in the week.      Angus G. Renard Matter, MD  Electronically Signed     AGM/MEDQ  D:  05/12/2006  T:  05/13/2006  Job:  [id number]

## 2010-12-02 NOTE — Group Therapy Note (Signed)
NAMEPRESTYN, MAHN NO.:  [id number]   MEDICAL RECORD NO.:  [id number]          PATIENT TYPE:  INP   LOCATION:  A203                          FACILITY:  [facility]   PHYSICIAN:  Catalina Pizza, M.D.        DATE OF BIRTH:  23-Feb-1927   DATE OF PROCEDURE:  DATE OF DISCHARGE:                                   PROGRESS NOTE   SUBJECTIVE:  [name] is a 77 year old African American female admitted  for septic left bursitis, elbow who had open debridement by Dr. Hilda Lias on  October 17.  She is continued on IV antibiotics.  She did have onset of some  chest discomfort and was seen by Dr. Dietrich Pates, started on nitroglycerin and  Lovenox.  She states she has not had any further chest pains at this time  and her pain in her left elbow is much improved.  She is requesting when she  can go home.   OBJECTIVE:  VITAL SIGNS:  Temperature:  99.1.  Blood pressure:  132/91.  Pulse:  71.  Respirations:  16.  95% on room air.  Her CBGs have been 125,  167, 127 respectively.  GENERAL:  This is an elderly African-American female lying in bed in no  acute distress.  HEENT:  Unremarkable.  NECK:  Supple.  Did not appreciate any JVD at this time.  LUNGS:  Good air movement throughout.  Did not appreciate any crackles.  CARDIOVASCULAR:  Regular rate and rhythm.  2/6 systolic murmur.  ABDOMEN:  Soft, nontender, nondistended.  Positive bowel sounds.  EXTREMITIES:  No lower extremity edema appreciated today compared to  yesterday.  Left arm is bandaged without significant drainage.  NEUROLOGIC:  No significant change.   LABORATORY DATA:  BNP is 406.  CBC showed a white count of 6.6, hemoglobin  10.6.  B-met showed sodium 138, potassium 3.6, chloride 108, CO2 25, glucose  160, BUN 11, creatinine 1, calcium 8.2.  Chest x-ray:  No chest x-ray attained as of yet.   IMPRESSION:  A 77 year old African-American female with a septic bursitis  who has known coronary artery disease status post CABG  who had some mild  chest discomfort.   ASSESSMENT/PLAN:  1. Septic bursitis.  Doing well on IV antibiotics at this time.  Will need      to transition into oral antibiotics prior to discharge.  The wound      culture showed Staph aureus but does not appear to be methicillin      resistant Staph aureus in nature and is sensitive to both vancomycin,      which she is receiving as well as Septra as well as Levaquin and I will      likely transition over to full dose one of these oral medicines at time      of discharge for probably a full 10-day course.  2. History of coronary artery disease status post coronary artery bypass      graft and chest discomfort.  She is continued on nitroglycerin as well      as  Lovenox and denies any problems at this time but given her      significant history, I agree that she probably needs further cardiac      workup.  Unclear whether this can be done as an outpatient or whether      the patient will be transferred for cardiac cath.  Will defer to Dr.      Dietrich Pates for this.  3. Insulin dependent diabetes mellitus.  She appears to be doing well on      her current dose of Lantus and we will continue on this.  4. Question of early congestive heart failure.  She does have significant      murmur and 2D echo is still pending.  She did have slightly elevated      BNP.  Re-checked today it shows slightly less but essentially the same.      She was given another dose of Lasix yesterday and appears to be doing      well as far as fluid status.  No further crackles and very minimal      lower extremity edema.  States her breathing is much improved.  We will      continue with the p.o. Lasix daily at this point and minimize her extra      fluid given.      Catalina Pizza, M.D.  Electronically Signed     ZH/MEDQ  D:  05/06/2006  T:  05/06/2006  Job:  [id number]

## 2010-12-02 NOTE — Group Therapy Note (Signed)
[name], [name]                ACCOUNT NO.:  [id number]   MEDICAL RECORD NO.:  [id number]          PATIENT TYPE:  INP   LOCATION:  A203                          FACILITY:  [facility]   PHYSICIAN:  Angus G. McInnis, MD   DATE OF BIRTH:  1927/04/01   DATE OF PROCEDURE:  05/07/2006  DATE OF DISCHARGE:                                   PROGRESS NOTE   SUBJECTIVE:  This patient is being treated with septic left bursitis,  remains on IV antibiotics, she also had recent chest pain which was improved  with nitroglycerin, also has insulin-dependent diabetes, questionable early  CHF and breathing is improved on Lasix.   OBJECTIVE:  VITAL SIGNS:  Blood pressure 128/58, respiration 18, pulse 64,  temperature 99, blood sugars have run from 127 to 192, hemoglobin 10.6,  hematocrit 31.0, the patient's BNP was 406.  HEART:  Regular rhythm.  LUNGS:  Diminished breath sounds.  ABDOMEN:  No palpable organs or masses.  EXTREMITIES:  The patient does have some tenderness and slight swelling of  left elbow.   ASSESSMENT:  The patient continues to improve following I&D of the left  elbow.  The patient was admitted with some septic bursitis.  She does have  long-standing history of insulin-dependent diabetes.  More recently, she has  had chest pain thought to be of cardiac origin.  This was relieved by  nitroglycerin.  She does have evidence of CHF which is being treated.  She  has been seen by cardiology and will be seen again today by them.  Continue  current regimen.      Angus G. Renard Matter, MD  Electronically Signed     AGM/MEDQ  D:  05/07/2006  T:  05/07/2006  Job:  [id number]

## 2010-12-02 NOTE — Group Therapy Note (Signed)
[name], [name]                ACCOUNT NO.:  [id number]   MEDICAL RECORD NO.:  [id number]           PATIENT TYPE:  INP   LOCATION:  A203                          FACILITY:  [facility]   PHYSICIAN:  Angus G. McInnis, MD   DATE OF BIRTH:  [birth year]   DATE OF PROCEDURE:  04/30/2006  DATE OF DISCHARGE:                                   PROGRESS NOTE   SUBJECTIVE:  This patient was admitted with left elbow pain thought  initially to be gout, but now is felt to be septic bursitis of left elbow.  She is being treated with antibiotics by Dr. Hilda Lias.  She did have gram-  positive cocci that is thought to be either staphylococcal or streptococcal.  She is responding to therapy.  She also had elevated blood sugars on  admission.  Blood sugars now are ranging from 72-209.   OBJECTIVE:  VITAL SIGNS:  Blood pressure 117/55, respirations 20, pulse 65,  temperature 99.2.  LUNGS:  Clear to P&A.  HEART:  Regular rhythm.  ABDOMEN:  No palpable organs or masses.  EXTREMITIES:  Tenderness and swelling of left elbow.   ASSESSMENT:  The patient is being treated for septic bursitis of left elbow.  She does have diabetes mellitus as well which seems to be in better control.   PLAN:  Continue her current regimen of IV antibiotic.      Angus G. Renard Matter, MD  Electronically Signed     AGM/MEDQ  D:  04/30/2006  T:  04/30/2006  Job:  [id number]

## 2010-12-02 NOTE — Group Therapy Note (Signed)
[name], [name]                ACCOUNT NO.:  [id number]   MEDICAL RECORD NO.:  [id number]           PATIENT TYPE:  INP   LOCATION:  A203                          FACILITY:  [facility]   PHYSICIAN:  Angus G. Renard Matter, MD   DATE OF BIRTH:  26-Aug-1926   DATE OF PROCEDURE:  DATE OF DISCHARGE:                                   PROGRESS NOTE   This patient is being treated for septic left bursitis.  She remains on IV  antibiotics.  She is insulin-dependent diabetic with CHF improved with Lasix  and has apparently diuresed some.   OBJECTIVE:  VITAL SIGNS:  Blood pressure 153/56, respirations 24, pulse 67,  temperature 98.3.  Blood sugars have ranged from 95 to 198.  Most recent  hemoglobin was 10.9, hematocrit 31.8.  HEART:  Regular rhythm.  LUNGS:  Diminished breath sounds.  ABDOMEN:  No palpable organs or masses.  The patient has tenderness left  elbow.   ASSESSMENT:  The patient was admitted with above-stated problems.  She  continues to improve.  Continue current regimen.      Angus G. Renard Matter, MD  Electronically Signed     AGM/MEDQ  D:  05/08/2006  T:  05/08/2006  Job:  [id number]
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
## 2010-12-02 NOTE — Discharge Summary (Signed)
NAMELATYRA, JAYE NO.:  [id number]   MEDICAL RECORD NO.:  [id number]          PATIENT TYPE:  INP   LOCATION:  2037                         FACILITY:  [facility]   PHYSICIAN:  Arturo Morton. Riley Kill, MD, FACCDATE OF BIRTH:  04-Aug-1926   DATE OF ADMISSION:  02/24/2008  DATE OF DISCHARGE:  02/27/2008                               DISCHARGE SUMMARY   ADDENDUM   Her CHADS score is 4+.      Maple Mirza, PA      Arturo Morton. Riley Kill, MD, [facility]  Electronically Signed    GM/MEDQ  D:  02/28/2008  T:  02/29/2008  Job:  [id number]   cc:   Margaretmary Bayley, M.D.  Gerrit Friends. Dietrich Pates, MD, [facility]

## 2010-12-02 NOTE — Group Therapy Note (Signed)
[name], [name]                ACCOUNT NO.:  [id number]   MEDICAL RECORD NO.:  [id number]           PATIENT TYPE:  INP   LOCATION:  A203                          FACILITY:  [facility]   PHYSICIAN:  Angus G. Renard Matter, MD   DATE OF BIRTH:  13-Apr-1927   DATE OF PROCEDURE:  DATE OF DISCHARGE:                                   PROGRESS NOTE   This patient was admitted with septic bursitis, left elbow.  She did have  I&D of this elbow yesterday by Dr. Hilda Lias, and tolerated procedure in a  satisfactory manner.   OBJECTIVE:  VITAL SIGNS:  Blood pressure 131/59, respirations 18, pulse  73,temp 99.3.  Blood sugars range from 72 to 347.  Her electrolytes are  relatively normal.  LUNGS:  Diminished breath sounds.  HEART:  Regular rhythm.  ABDOMEN:  No palpable organs or masses.  The patient has dressing on left  elbow.   ASSESSMENT:  1. The patient has septic bursitis left elbow, status post incision and      drainage.  2. She also has brittle diabetes.  Sugars are being monitored.   She remains on long acting insulin and sliding scale Humalog.  Continue  current regimen.      Angus G. Renard Matter, MD  Electronically Signed     AGM/MEDQ  D:  05/01/2006  T:  05/01/2006  Job:  [id number]

## 2010-12-02 NOTE — Group Therapy Note (Signed)
NAMEJAILINE, LIEDER NO.:  [id number]   MEDICAL RECORD NO.:  [id number]          PATIENT TYPE:  INP   LOCATION:  A203                          FACILITY:  [facility]   PHYSICIAN:  Catalina Pizza, M.D.        DATE OF BIRTH:  16-Jan-1927   DATE OF PROCEDURE:  DATE OF DISCHARGE:                                   PROGRESS NOTE   SUBJECTIVE:  [name] is a 77 year old African American female who had a  significant septic left bursitis on the elbow which was openly debrided by  Dr. Hilda Lias on October 17.  She has continued on IV antibiotics for this and  appears to be doing well from this standpoint.  Apparently she did have some  chest discomfort which she states she has periodically and consultation was  obtained for Dr. Dietrich Pates.  She does have significant coronary artery  disease history with status post CABG and it has been some time since she  has had this reassessed.  She did have some elevation in her cardiac markers  mildly and was started on nitroglycerin as well as low molecular weight  heparin.  She states she does not have any significant chest pain at this  time and does occasionally have an episode of some shortness of breath.  She  states the pain from her elbow is much improved.   OBJECTIVE:  VITAL SIGNS:  Temperature is 99.9, blood pressure 144/70, pulse  89, respirations 18, satting 96% on room air.  CBGs have been 131, 118, and  193 respectively.  GENERAL:  This is an elderly African American female lying in bed in no  acute distress.  HEENT:  Unremarkable.  NECK:  Supple.  Does have some mild JVD appreciated at 45 degrees.  LUNGS:  Few crackles at the bases bilaterally.  CARDIAC:  Regular rate and rhythm with 2/6 systolic murmur.  ABDOMEN:  Soft, nontender, nondistended, positive bowel sounds.  EXTREMITIES:  Trace lower extremity edema, stable.  Left arm is bandaged  without significant drainage at this time.  NEURO:  No significant change.   LABORATORY DATA:  Lipid profile obtained yesterday showed total cholesterol  of 116, triglycerides 91, HDL 48, LDL 50, TSH was normal at 3.6, BNP was  elevated at 422.  Cardiac panel from yesterday showed CK of 141, MB 5.5,  troponin-I of 0.12.  This morning CBC shows white count 7.2, hemoglobin  10.5, platelet count of 309, and cardiac panel shows CK of 128, MB of 3.8,  troponin-I of 0.08.   IMPRESSION:  This is a 77 year old African American female with a recent  septic bursitis who has also developed some mild chest discomfort.   ASSESSMENT AND PLAN:  1. Septic bursitis.  She is continued on IV antibiotics at this time.  She      did have a low-grade temperature at 99.9 this morning.  Will continue      to monitor along with Dr. Sanjuan Dame help with this.  2. History of coronary artery disease status post coronary artery bypass  grafting and chest discomfort.  Was seen and assessed by Dr. Dietrich Pates      and felt that since she has not had a significant workup, will consider      doing angiography next week possibly.  Continue on nitroglycerin and      low molecular weight heparin at this time.  She states that she does      not have any pain at this time.  3. Insulin-dependent diabetes mellitus.  Her blood sugars have remained      relatively good but need to maximize therapy at this time.  She is to      continue as Lantus as well as the sliding scale insulin.  4. Question congestive heart failure.  She does appear to have a      significant murmur and will need to get a 2D echo to reassess.      Slightly elevated BNP.  She is on 60 mg of Lasix daily but feel that      she may continue to have a little bit of fluid on board.  Will give her      another dose of IV Lasix at this time and repeat chest x-ray in the      morning.      Catalina Pizza, M.D.  Electronically Signed     ZH/MEDQ  D:  05/05/2006  T:  05/05/2006  Job:  [id number]

## 2010-12-02 NOTE — Group Therapy Note (Signed)
NAMELASHANA, SPANG                ACCOUNT NO.:  [id number]   MEDICAL RECORD NO.:  [id number]           PATIENT TYPE:  INP   LOCATION:  A203                          FACILITY:  [facility]   PHYSICIAN:  Angus G. Renard Matter, MD   DATE OF BIRTH:  [birth year]   DATE OF PROCEDURE:  DATE OF DISCHARGE:                                   PROGRESS NOTE   This patient is being treated for septic left bursitis.  She remains on  antibiotics.  Culture did show evidence of methicillin resistant Staph.  She  is having some chest pain which improved with nitroglycerin.  She has  insulin dependent diabetes, questionable early CHF, her breathing has  improved and the patient remains on Lasix.   OBJECTIVE:  Blood pressure 127/78, respirations 20, pulse 67, temperature  98.7.  Blood sugars ranged from 83 to 162.  Hemoglobin 10.8,  hematocrit  31.8.  Heart regular rhythm.  Lungs diminished breath sounds.  Abdomen no  palpable organs or masses.  The patient has some tenderness remaining in the  left arm.   ASSESSMENT:  The patient is admitted with septic bursitis of the left elbow.  She has a long-standing history of insulin dependent diabetes mellitus, more  recently has had chest pain thought to be of cardiac origin.  She does have  CHF which is being treated.   PLAN:  Continue IV antibiotics.  Will attempt to get this set up as an  outpatient.      Angus G. Renard Matter, MD  Electronically Signed     AGM/MEDQ  D:  05/09/2006  T:  05/09/2006  Job:  [id number]

## 2010-12-02 NOTE — Consult Note (Signed)
[name], [name]                ACCOUNT NO.:  [id number]   MEDICAL RECORD NO.:  [id number]          PATIENT TYPE:  INP   LOCATION:  A203                          FACILITY:  [facility]   PHYSICIAN:  J. Darreld Mclean, M.D. DATE OF BIRTH:  [birth year]   DATE OF CONSULTATION:  DATE OF DISCHARGE:                                   CONSULTATION   The patient seen at the request of Dr. , and Dr. Margo Aye.   The patient is a 77 year old female who presented to the emergency room with  infected left olecranon bursa area.  She says she has been having some  tenderness to the left elbow for about 2 weeks but it got red and sore just  in the last several days and then it got progressively worse.  It started  draining.  She denies any trauma to the area.  She denies any activity,  punctures, or the like.  She has been afebrile but her white count is  elevated.  The elbow is markedly red, markedly swollen, and draining.  It is  very tender.  Range of motion is painful but she can fully extend the elbow.  The area extends pass the olecranon bursa area up to the proximal ulnar  area.  It is draining.  It is purulent.  The skin around it is very  erythematous.   I stuck a needle in it and the fluid is very thick.  I was able to express  about approximately 7 to 10 cc of purulent material.  Cultures have already  been done.  The area was redressed, rewrapped.   She may need formal I and D of this wound, depending on how she responds to  the IV vancomycin which she is on.  I will continue the IV vancomycin.   IMPRESSION:  Infected olecranon bursa, proximal left elbow forearm area.   Continue the IV vancomycin.  We will follow with you.           ______________________________  J. Darreld Mclean, M.D.     JWK/MEDQ  D:  04/28/2006  T:  04/29/2006  Job:  [id number]

## 2010-12-02 NOTE — Group Therapy Note (Signed)
[name], [name]                ACCOUNT NO.:  [id number]   MEDICAL RECORD NO.:  [id number]           PATIENT TYPE:  INP   LOCATION:  A203                          FACILITY:  [facility]   PHYSICIAN:  Angus G. McInnis, MD   DATE OF BIRTH:  06/25/1927   DATE OF PROCEDURE:  DATE OF DISCHARGE:                                   PROGRESS NOTE   SUBJECTIVE:  This patient was admitted with pain in her left elbow thought  initially to be gout, but now is felt to be septic bursitis of the left  elbow, which is being treated by Dr. Hilda Lias.  She did have I&D on Monday of  the left elbow.  She continues to have some pain in the left elbow.   OBJECTIVE:  VITAL SIGNS:  Blood pressure 137/76, respirations 16, pulse 70  and temperature is 97.9.  Blood sugars have ranged from 99-218.  The patient  remains on Lantus and sliding-scale Humalog insulin.  She was transfused  yesterday because of hemoglobin 7.9 and hematocrit of 23.5; current  hemoglobin is 9.6, hematocrit 28.2.  Anemia profile:  Serum iron 33, iron  binding capacity 164, ferritin 241.  HEART:  Regular rhythm.  LUNGS:  Diminished breath sounds.  ABDOMEN:  No palpable organs or masses.  EXTREMITIES:  The patient has tenderness and swelling of the left elbow.   ASSESSMENT:  1. The patient has septic bursitis of left elbow.  2. She does have insulin-dependent diabetes.   PLAN:  Continue current regimen.  Will adjust insulin dosage as needed.  Continue IV antibiotics.      Angus G. Renard Matter, MD  Electronically Signed     AGM/MEDQ  D:  05/02/2006  T:  05/03/2006  Job:  [id number]

## 2010-12-02 NOTE — Discharge Summary (Signed)
[name], [name]                ACCOUNT NO.:  [id number]   MEDICAL RECORD NO.:  [id number]          PATIENT TYPE:  INP   LOCATION:  A203                          FACILITY:  [facility]   PHYSICIAN:  Angus G. Renard Matter, MD   DATE OF BIRTH:  1926/12/30   DATE OF ADMISSION:  04/27/2006  DATE OF DISCHARGE:  10/29/2007LH                               DISCHARGE SUMMARY   16 days hospitalization.   DIAGNOSES:  1. Septic bursitis left elbow.  2. Insulin-dependent diabetes.  3. Congestive heart failure.  4. Coronary artery disease.   CONDITION:  Stable and improved at the time of discharge.   HISTORY:  This 77 year old African American female with a history of  hypertensive heart disease, mitral regurgitation, atrial flutter,  history of CABG times 4 in 1998, was admitted with pain in the left  elbow.  She was treated with what was thought to be gouty arthritis,  started on steroid Dosepak, came in with open draining wound on the left  elbow with purulent material.  Her appetite had decreased over the past  week and her sugars have been more elevated.  She was admitted initially  with a blood sugar in the 600 range.  In the Emergency Department was  started on an insulin drip which brought it down rapidly.  She was also  started on broad-spectrum antibiotics.  Attempts were made to get  orthopedics to see.  She was subsequently admitted.   EXAMINATION:  GENERAL:  Uncomfortable elderly African American female.  VITAL SIGNS:  Blood pressure 111/61, pulse 78, respiration 12.  HEENT: Eyes: PERRLA.  TMs negative.  Oropharynx benign.  NECK:  Supple.  No JVD or thyroid abnormalities.  HEART:  Regular rhythm and 1/6 systolic murmur left upper sternal  border.  LUNGS:  Clear to P&A.  ABDOMEN:  No palpable organs or masses.  MUSCULOSKELETAL:  The patient has a large left elbow blistering of the  skin, inflammation of skin, several draining areas of purulent material.   LABORATORY DATA:  Admission  CBC: WBC of 13,000 with hemoglobin 11.2,  hematocrit 33.4.  Subsequent CBC April 30, 2006 WBC 10,900 with  hemoglobin 10.8, hematocrit 32.1 and on May 03, 2006 hemoglobin 8.9,  hematocrit 25.6.  Chemistries on admission:  Sodium 130, potassium 5.5,  chloride 91, CO2 31, BUN 52, creatinine 1.5, calcium 9.2.  Subsequent  chemistries on May 06, 2006:  Sodium 138, potassium 3.6, chloride  108, CO2 25, glucose 160, BUN 11, creatinine 1.  CK 106, CK-MB 4.1,  troponin 0.03.  Subsequently CK of 141, CK-MB 5.5, relative index 3.9,  troponin 0.06.  Lipid profile:  Cholesterol 116, triglycerides 91, LDL  cholesterol 50, HDL 48.  Serum iron 33, saturation 20, iron binding  capacity 131.  Urinalysis 11 to 20 WBC's.  Wound culture:  Few gram  positive cocci.  Few colonies of staph aureus.  Blood cultures negative.  X-rays of the elbow posterior soft tissue swelling.  No acute bony  abnormality Chest x-ray: Bibasilar atelectasis and small right pleural  effusion.  Stress Myoview normal.  Adenosine Myoview with  ejection  fraction 60%.  Electrocardiogram normal sinus rhythm, left atrial  enlargement.   HOSPITAL COURSE:  The patient at time of her admission was placed on an  1800 calorie ADA diet.  [facility] standing orders.  Half normal saline at  100 cc/hour.  She was given a gram intravenously of vancomycin and then  she was dosed according to pharmaceutical protocol.  She was given 2-4  mg of morphine intravenously every 2 hours p.r.n. for pain.  She was  given a bolus of insulin 10 units in the ED then placed on insulin drip.  Accu-Cheks a.c. and h.s. Her sugars came down rapidly and insulin drip  was stopped.  Shortly after admission she was given nasal O2 at 2  liters.  Consultation was put in for Dr. Hilda Lias to see her.  She was  continued on intravenous vancomycin.  Peak and trough levels were  monitored.  She was placed on Lantus insulin 20 units each a.m., sliding  scale NovoLog  insulin.  The patient had a PICC line inserted on April 30, 2006.  She was taken to surgery by Dr. Hilda Lias October 15 where she  had I&D of the left elbow.  The patient had subsequent pulse lavage  daily done.  She also had dressing changes to the wound.  The patient  did have a drop in the hemoglobin, was given a unit of blood, placed on  FeSO4  325 mg b.i.d.  Stool was monitored for occult blood.  The patient  did have some chest pain throughout the latter part of her hospital  stay.  Cardiac enzymes were monitored.  She was seen in consultation by  cardiology.  Echocardiogram was obtained which did not show abnormality.  She was placed on Lovenox 1 mg per kilo q. 12 hours, Amlodipine  mg  daily.  Adenosine stress nuclear test was ordered.  The patient remained  on vancomycin until May 11, 2006.  On May 14, 2006 PICC line was  removed and she was discharged home.  She was to continue pulse lavage  as an outpatient with home health care assistance.  The patient was  stable at time of discharge.   MEDICATIONS:  Medications at the time of discharge:  Cipro 500 mg  b.i.d., lisinopril 20 mg daily, ferrous sulfate 325 mg b.i.d., KCl 20  mEq daily, sliding scale Humalog insulin, Lantus insulin.  She was to  continue lovastatin 10 mg daily, metoprolol 50 mg b.i.d., lorazepam 0.5  mg every 4 hours p.r.n., furosemide 60 mg daily, isosorbide 60 mg daily,  Synthroid 0.025 mg daily, quinapril 20 mg daily, Detrol LA 4 mg daily.      Angus G. Renard Matter, MD  Electronically Signed     AGM/MEDQ  D:  06/22/2006  T:  06/22/2006  Job:  [id number]

## 2010-12-02 NOTE — Procedures (Signed)
NAMEARAYAH, KROUSE NO.:  [id number]   MEDICAL RECORD NO.:  [id number]          PATIENT TYPE:  INP   LOCATION:  A203                          FACILITY:  [facility]   PHYSICIAN:  Cecil Cranker, MD, FACCDATE OF BIRTH:  22-Oct-1926   DATE OF PROCEDURE:  DATE OF DISCHARGE:                                    STRESS TEST   ADENOSINE MYOVIEW STUDY:  Ms. Bruning is a pleasant 77 year old black  female with intermittent chest pain.  She received 34 mg of Adenosine.  She  developed substernal chest pain, relieved promptly after discontinuation of  the Adenosine.  There was no significant arrhythmia.  No ST changes.   Myoview was injected.  The results are pending.           ______________________________  E. Graceann Congress, MD, [facility]     EJL/MEDQ  D:  05/08/2006  T:  05/09/2006  Job:  [id number]

## 2010-12-02 NOTE — Group Therapy Note (Signed)
NAMEMARLICIA, [name]                ACCOUNT NO.:  [id number]   MEDICAL RECORD NO.:  [id number]          PATIENT TYPE:  INP   LOCATION:  A203                          FACILITY:  [facility]   PHYSICIAN:  Angus G. Renard Matter, MD   DATE OF BIRTH:  01-02-1927   DATE OF PROCEDURE:  DATE OF DISCHARGE:                                   PROGRESS NOTE   This patient is being treated for septic left bursitis.  She remains on IV  antibiotics.  She is an insulin-dependent diabetic and has CHF, which has  improved.  She is still receiving pulse lavage and debridement of the elbow,  which has minimal drainage.   OBJECTIVE:  VITAL SIGNS:  Blood pressure 148/82, respirations 20, pulse 67,  temp 98.9.  Blood sugars have ranged from a low of 45 to a high of 235.   ASSESSMENT:  The patient is improving with the above-stated problems.   PLAN:  To discharge the patient over the weekend, continue pulse lavage  through physical therapy.  Continue oral antibiotics.      Angus G. Renard Matter, MD  Electronically Signed     AGM/MEDQ  D:  05/11/2006  T:  05/12/2006  Job:  [id number]

## 2010-12-02 NOTE — Group Therapy Note (Signed)
NAMEMIONNA, ADVINCULA                ACCOUNT NO.:  [id number]   MEDICAL RECORD NO.:  [id number]          PATIENT TYPE:  INP   LOCATION:  A203                          FACILITY:  [facility]   PHYSICIAN:  Angus G. Renard Matter, MD   DATE OF BIRTH:  1926-08-26   DATE OF PROCEDURE:  DATE OF DISCHARGE:                                   PROGRESS NOTE   This patient is being treated for septic left bursitis, she remains on IV  antibiotics.  She is an insulin-dependent diabetic and has CHF, which has  improved.  She is still receiving pulse lavage and debridement of the elbow,  has minimal drainage.   OBJECTIVE:  VITAL SIGNS:  Blood pressure 141/58, respiration 20, pulse 65,  temp 99.3.  Blood sugars range from 205 to 276.  HEART:  Regular rhythm.  LUNGS:  Clear.  ABDOMEN:  No palpable organs or masses.  The patient has tenderness in the left elbow.   ASSESSMENT:  The patient is still being treated for septic bursitis of the  left elbow.  She also is an insulin-dependent diabetic with CHF, and has  improved.   PLAN:  To continue current regimen.      Angus G. Renard Matter, MD  Electronically Signed     AGM/MEDQ  D:  05/10/2006  T:  05/11/2006  Job:  [id number]

## 2010-12-02 NOTE — Op Note (Signed)
[name], [name]                ACCOUNT NO.:  [id number]   MEDICAL RECORD NO.:  [id number]          PATIENT TYPE:  INP   LOCATION:  A203                          FACILITY:  [facility]   PHYSICIAN:  J. Darreld Mclean, M.D. DATE OF BIRTH:  April 03, 1927   DATE OF PROCEDURE:  DATE OF DISCHARGE:                                 OPERATIVE REPORT   PREOPERATIVE DIAGNOSIS:  Infection in the left elbow area.   POSTOPERATIVE DIAGNOSIS:  Infection in the left elbow area.   PROCEDURE:  Staged incision and drainage, irrigation and debridement of the  left elbow infection, procedure #2.   SURGEON:  J. Darreld Mclean, M.D.   ANESTHESIA:  General.   DRESSINGS:  The area was packed with fine mesh Betadine gauze.   Bulky dressing applied at the end of the procedure.   INDICATION:  The patient is a 77 year old female with significant diabetes  mellitus and a very bad Staphylococcus aureus infection of her left elbow  area.  She was brought to surgery 2 days ago where she had an incision and  drainage, irrigation and debridement and packing of the wound with 2 rolls  of fine mesh Betadine gauze, 2 inch.  She is brought back today for removal  of the gauze packings and further irrigation and debridement.  She has  remained afebrile.  This is a staged procedure.  I have told her and the  family that we may need to do this again, depending on how the wound looks  today and she will need physical therapy and pulse lavage later.  She is  aware of the significance of her infection and she is aware that her  diabetes needs to be controlled while this is being treated.  The patient  did receive blood yesterday, as her hemoglobin dropped.   DESCRIPTION OF PROCEDURE:  The patient was seen in the holding area.  She  identified the left arm and elbow as the correct surgical site.  I placed a  mark on the dressing and on the arm and so did she.  She was brought back to  the operating room.  She was placed supine on  the operating room table and  she was given a general anesthetic.  Once this was done, the previous  dressing and packing were removed.   Once this was done, it was apparent that the arm looked significantly  improved.  There was very little erythema around the arm.  It was not as  swollen and when the packing was removed, a considerable amount of necrotic-  type material came with it.  There was some granulation tissue now present.  The arm just looked much, much better.  She was then prepped and draped in  the usual manner.  We had a time-out, identified the patient and deemed the  left arm was the correct surgical site.  Then I used the Water-Pik lavage  system and irrigated the arm with another 3 L of saline.  Basically, the  material that came back was clear, a little blood-tinged.  The arm looked so  much better today; it was just amazing.  At the end of this, then I packed  some fine mesh Betadine gauze, not near as much as I had done before.  I put  a bulky dressing, ABDs and a significant amount of sterile Webril on the  arm.  An Ace bandage was applied loosely.   I will go ahead and set her up for physical therapy beginning tomorrow with  pulse lavage.  We will continue the IV vancomycin and hopefully by the  weekend we can arrange her to be treated as an outpatient.   She tolerated the procedure well and will go to Recovery in good condition.           ______________________________  [name]. Darreld Mclean, M.D.     JWK/MEDQ  D:  05/02/2006  T:  05/03/2006  Job:  [id number]

## 2010-12-02 NOTE — Procedures (Signed)
NAME:  [name], [name]                          ACCOUNT NO.:  [id number]   MEDICAL RECORD NO.:  [id number]                   PATIENT TYPE:  AMB   LOCATION:  DAY                                  FACILITY:  [facility]   PHYSICIAN:  Vida Roller, M.D.                DATE OF BIRTH:  Apr 09, 1927   DATE OF PROCEDURE:  DATE OF DISCHARGE:                                  ECHOCARDIOGRAM   PROCEDURE:  Transesophageal echocardiogram.   HISTORY OF PRESENT ILLNESS:  [name] is 77 year old African-American  woman with significant hypertensive heart disease and moderate mitral  regurgitation who has atrial flutter.  She was evaluated back in March at  [facility] at which point she had a transesophageal echocardiogram  in preparation for an atrial flutter ablation and was found to have a left  atrial thrombus in her left atrial appendage and the procedure was  cancelled. She was put on Coumadin for 6 weeks and referred to me. We were  able to get a transesophageal echocardiogram done today.   DETAILS OF THE PROCEDURE:  The patient was brought to the same day surgery  suite where she was connected to the monitoring system with continuous heart  rate, blood pressure and O2 saturation, as well as respiratory rate  monitoring was obtained.  Hurricaine spray was used to anesthetize the  oropharynx and ameliorate the gag reflex.  Then she received conscious  sedation with 5 mg of Versed and 25 mcg of Fentanyl.   The echo probe was then placed into the oropharynx and passed without  difficulty into the stomach where it was retroflexed and transgastric views  of the heart were obtained.  The transesophageal echo probe was then  repositioned in the midesophagus where multiple views were obtained.  The  probe was then removed . She tolerated the procedure well and was discharged  in good condition.   RESULTS:  1. The left ventricle is hypertrophied with normal left ventricular systolic  function.  There were no obvious wall motion abnormalities.  2. The right ventricle is also hypertrophied specifically in its free wall.     It has normal systolic function and there were no obvious wall motion     abnormalities.  3. Both atria are markedly enlarged, left slightly greater than right.     There is no atrial septal defect to color.  The atrial appendage has     multiple trabeculations, but does not appear to have either a slow flow     state or an obvious thrombus. The pulse wave and Doppler evaluation both     show pulsatile flow in the left atrial appendage.  4. The aortic valve is trileaflet and tricommisural with mild thickening at     the leaflet tips.  There is trivial insufficiency.  No stenosis was seen.  5. The mitral valve has mild thickening and a myxomatous  pattern.  There is     moderate mitral regurgitation.  No stenosis is seen.  6. The tricuspid valve is morphologically unremarkable with mild-to-moderate     regurgitation.  No stenosis is seen.  7. The pulmonic valve is morphologically unremarkable with trivial     insufficiency.  No stenosis is seen.    ASSESSMENT:  This is a woman with previous history of atrial thrombus who  has now been on Coumadin an appropriate time and appears to have resolution  of the atrial thrombus.                                                Vida Roller, M.D.    JH/MEDQ  D:  12/26/2002  T:  12/26/2002  Job:  [id number]

## 2010-12-02 NOTE — Group Therapy Note (Signed)
NAMEBELEN, PESCH NO.:  [id number]   MEDICAL RECORD NO.:  [id number]          PATIENT TYPE:  INP   LOCATION:  A203                          FACILITY:  [facility]   PHYSICIAN:  Catalina Pizza, M.D.        DATE OF BIRTH:  02-11-1927   DATE OF PROCEDURE:  04/29/2006  DATE OF DISCHARGE:                                   PROGRESS NOTE   SUBJECTIVE:  Ms. Hackel is a 77 year old African-American female who was  having left elbow pain initially thought secondary to gout, but with  progressive pain and erythema, as well as her beginning to have some  drainage she came to the emergency department and apparently has septic type  bursitis.  She was seen and evaluated by Dr. Hilda Lias yesterday who expressed  7-10 mL of pus out of this.  She states the pain is much improved  and  continues to drain serosanguineous fluid.  She denies any other significant  problems.  Also of note when she was admitted her blood sugars were up in  the 600 range and she was started on an intravenous insulin drip, and this  probably came.   OBJECTIVE:  VITAL SIGNS:  Temperature 98.7 with a temperature maximum of  99.6 this morning.  Blood pressure is 164/82.  Pulse 90.  Respirations 18.  CBGs gave 251, 203 and 209 respectively.  Saturation is 98% on room air.  GENERAL APPEARANCE:  Generally this is an elderly African-American female  lying in bed and in no acute distress.  HEENT:  The head, eyes, ears, nose and throat are unremarkable.  LUNGS:  The lungs are clear to auscultation bilaterally.  There is good air  movement throughout.  HEART:  The heart has a regular rate and rhythm.  No murmurs appreciated.  ABDOMEN:  The abdomen is soft, nontender and nondistended with positive  bowel sounds.  EXTREMITIES:  There is no lower extremity edema.  NEUROLOGIC EXAMINATION:  Neurologically the patient is in intact.  SKIN:  The skin on the left elbow has a wound draining bloody to  serosanguineous fluid.  It  is not draining white pustular material, but  still does have some fluctuance in the lateral aspect of the forearm with  decreased erythema and warmth.   LABORATORY DATA:  CBC showed a white count of 10.3, hemoglobin 11.0.  BMET  showed sodium 135, potassium 5.0, chloride 103, CO2 27, glucose 184, BUN 25,  creatinine 1.2, and calcium 8.7.  Blood cultures times two are negative to  date.  Routine culture of the wound revealed gram positive cocci in  clusters.   IMPRESSION:  This is a 77 year old with left elbow likely septic bursitis.   ASSESSMENT AND PLAN:  1. Left elbow septic bursitis.  This is being followed by Dr. Hilda Lias as well.  She does have improved pain  in this; and, I question whether it will need further intervention with  further debridement.   We will continued with the vancomycin now; she did have gram positive cocci  either staphylococcal or streptococcal given the significance  of the  infection, we will get the sensitivities on this and adjust therapy  appropriately.   1. For all the other medical issues including high blood pressure and      diabetes we will adjust the medications appropriately and resume all      her medicines as seen on her medication record sheet, which was not      available yesterday.      Catalina Pizza, M.D.  Electronically Signed     ZH/MEDQ  D:  04/29/2006  T:  04/29/2006  Job:  [id number]

## 2010-12-02 NOTE — Group Therapy Note (Signed)
NAMELABREA, ECCLESTON                ACCOUNT NO.:  [id number]   MEDICAL RECORD NO.:  [id number]           PATIENT TYPE:  INP   LOCATION:  A203                          FACILITY:  [facility]   PHYSICIAN:  Angus G. Renard Matter, MD   DATE OF BIRTH:  Aug 06, 1926   DATE OF PROCEDURE:  05/03/2006  DATE OF DISCHARGE:                                   PROGRESS NOTE   SUBJECTIVE:  This patient was admitted with septic bursitis of the left  elbow.  She did have I&D available by Dr. Hilda Lias and tolerated procedure  well.  She did have additional surgical procedure on the left elbow  yesterday.  She does have brittle diabetes.   OBJECTIVE:  VITAL SIGNS:  Blood pressure 90/63, respirations 24, pulse 116,  temperature 99.4.  the patient does have hemoglobin of 8.9, hematocrit 25.6.  Electrolytes are normal with the exception of glucose 133.  HEART:  Regular rate and rhythm.  LUNGS:  Clear to P&A.  ABDOMEN:  No palpable organs or masses.  The patient has dressing on the  left elbow.  Remains swollen and tender.   ASSESSMENT:  The patient has septic bursitis in the left elbow and insulin-  dependent diabetes.   PLAN:  Continue current regimen.      Angus G. Renard Matter, MD  Electronically Signed     AGM/MEDQ  D:  05/03/2006  T:  05/04/2006  Job:  [id number]

## 2010-12-02 NOTE — Procedures (Signed)
[name], [name]:  [id number]   MEDICAL RECORD NO.:  [id number]          PATIENT TYPE:  INP   LOCATION:  A203                          FACILITY:  [facility]   PHYSICIAN:  Gerrit Friends. Dietrich Pates, MD, FACCDATE OF BIRTH:  04-Jan-1927   DATE OF PROCEDURE:  05/04/2006  DATE OF DISCHARGE:                                  ECHOCARDIOGRAM   CLINICAL DATA:  A 77 year old woman with a murmur and hypertension.  Aorta  2.3, left atrium 4.3, septum 1.5, posterior wall 1.2, LV diastole 4.0, LV  systole 2.9.  1. Technically adequate echocardiographic study.  2. Mild to moderate right atrial enlargement; moderate left atrial      enlargement.  3. Normal right ventricular size and function; RVH present.  4. Normal pulmonic valve and proximal pulmonary artery.  5. Trileaflet aortic valve that is mildly sclerotic; mild calcification of      the wall of the proximal ascending aorta; very mild aortic      insufficiency.  6. Normal tricuspid valve; moderate regurgitation; moderate elevation in      estimated RV systolic pressure.  7. Mild mitral valve thickening and calcification; moderate mitral      regurgitation.  8. Normal left ventricular size; mild to moderate hypertrophy; normal      systolic function.  9. Mild IVC dilatation; decrease in IVC dimensions with inspiration is      borderline.  10.Comparison with a TEE performed December 26, 2002:  No significant interval      change.      Gerrit Friends. Dietrich Pates, MD, [facility]  Electronically Signed     RMR/MEDQ  D:  05/07/2006  T:  05/08/2006  Job:  [id number]

## 2011-04-07 LAB — URINALYSIS, ROUTINE W REFLEX MICROSCOPIC
Glucose, UA: >1000 — AB
Ketones, ur: NEGATIVE
pH: 5.5

## 2011-04-07 LAB — BASIC METABOLIC PANEL
BUN: 15
BUN: 24 — ABNORMAL HIGH
BUN: 30 — ABNORMAL HIGH
CO2: 24
CO2: 26
CO2: 27
CO2: 27
Calcium: 8.5
Calcium: 9.1
Calcium: 9.6
Chloride: 109
Creatinine, Ser: 1.06
Creatinine, Ser: 1.15
Creatinine, Ser: 1.3 — ABNORMAL HIGH
Creatinine, Ser: 1.68 — ABNORMAL HIGH
GFR calc Af Amer: 36 — ABNORMAL LOW
GFR calc Af Amer: >60
GFR calc non Af Amer: 29 — ABNORMAL LOW
Glucose, Bld: 91

## 2011-04-07 LAB — LIPID PANEL
HDL: 59
Total CHOL/HDL Ratio: 2.6
Triglycerides: 98
VLDL: 20

## 2011-04-07 LAB — COMPREHENSIVE METABOLIC PANEL
AST: 28
CO2: 27
Calcium: 9.2
Creatinine, Ser: 1.41 — ABNORMAL HIGH
GFR calc Af Amer: 44 — ABNORMAL LOW
GFR calc non Af Amer: 36 — ABNORMAL LOW

## 2011-04-07 LAB — CBC
HCT: 30 — ABNORMAL LOW
HCT: 38
MCHC: 33.4
MCHC: 33.7
MCHC: 33.8
MCHC: 34.6
MCV: 92.8
MCV: 93.8
MCV: 94.1
Platelets: 143 — ABNORMAL LOW
Platelets: 144 — ABNORMAL LOW
Platelets: 173
RBC: 3.41 — ABNORMAL LOW
RBC: 3.66 — ABNORMAL LOW
RDW: 12.9

## 2011-04-07 LAB — URINE MICROSCOPIC-ADD ON

## 2011-04-07 LAB — I-STAT 8, (EC8 V) (CONVERTED LAB)
Acid-Base Excess: 3 — ABNORMAL HIGH
Chloride: 101
pCO2, Ven: 34.1 — ABNORMAL LOW
pH, Ven: 7.489 — ABNORMAL HIGH

## 2011-04-07 LAB — B-NATRIURETIC PEPTIDE (CONVERTED LAB): Pro B Natriuretic peptide (BNP): 123 — ABNORMAL HIGH

## 2011-04-07 LAB — IRON AND TIBC
Iron: 37 — ABNORMAL LOW
Saturation Ratios: 20
TIBC: 187 — ABNORMAL LOW
UIBC: 150

## 2011-04-07 LAB — DIFFERENTIAL
Basophils Absolute: 0
Basophils Relative: 1
Eosinophils Absolute: 0.1
Eosinophils Relative: 1
Lymphs Abs: 2.3

## 2011-04-07 LAB — HEMOGLOBIN A1C: Mean Plasma Glucose: 325

## 2011-04-07 LAB — POCT I-STAT CREATININE: Creatinine, Ser: 1.8 — ABNORMAL HIGH

## 2011-04-10 LAB — POCT CARDIAC MARKERS
Myoglobin, poc: 301
Operator id: 151321
Operator id: 151321
Troponin i, poc: 2.97

## 2011-04-10 LAB — POCT I-STAT 3, ART BLOOD GAS (G3+)
Acid-Base Excess: 2
Bicarbonate: 25.7 — ABNORMAL HIGH
Operator id: 284031
Patient temperature: 98
pH, Arterial: 7.463 — ABNORMAL HIGH

## 2011-04-10 LAB — BASIC METABOLIC PANEL
BUN: 10
BUN: 15
BUN: 19
BUN: 29 — ABNORMAL HIGH
BUN: 49 — ABNORMAL HIGH
CO2: 23
CO2: 24
CO2: 26
Calcium: 8.6
Calcium: 8.8
Calcium: 9.1
Calcium: 9.2
Chloride: 109
Creatinine, Ser: 1.06
Creatinine, Ser: 1.16
Creatinine, Ser: 1.75 — ABNORMAL HIGH
GFR calc Af Amer: 34 — ABNORMAL LOW
GFR calc Af Amer: 55 — ABNORMAL LOW
GFR calc Af Amer: 56 — ABNORMAL LOW
GFR calc non Af Amer: 45 — ABNORMAL LOW
GFR calc non Af Amer: 46 — ABNORMAL LOW
GFR calc non Af Amer: 46 — ABNORMAL LOW
GFR calc non Af Amer: 50 — ABNORMAL LOW
Glucose, Bld: 170 — ABNORMAL HIGH
Glucose, Bld: 252 — ABNORMAL HIGH
Glucose, Bld: 267 — ABNORMAL HIGH
Glucose, Bld: 282 — ABNORMAL HIGH
Potassium: 4
Potassium: 5.3 — ABNORMAL HIGH
Sodium: 133 — ABNORMAL LOW

## 2011-04-10 LAB — COMPREHENSIVE METABOLIC PANEL
Alkaline Phosphatase: 83
BUN: 54 — ABNORMAL HIGH
CO2: 25
Chloride: 97
Creatinine, Ser: 1.73 — ABNORMAL HIGH
GFR calc non Af Amer: 28 — ABNORMAL LOW
Glucose, Bld: 146 — ABNORMAL HIGH
Potassium: 4.6
Total Bilirubin: 0.6

## 2011-04-10 LAB — HEPARIN LEVEL (UNFRACTIONATED)
Heparin Unfractionated: 0.16 — ABNORMAL LOW
Heparin Unfractionated: 0.24 — ABNORMAL LOW
Heparin Unfractionated: 0.64
Heparin Unfractionated: 0.71 — ABNORMAL HIGH
Heparin Unfractionated: <0.1 — ABNORMAL LOW

## 2011-04-10 LAB — CBC
HCT: 27.4 — ABNORMAL LOW
HCT: 28.6 — ABNORMAL LOW
HCT: 32.1 — ABNORMAL LOW
HCT: 34.1 — ABNORMAL LOW
HCT: 34.7 — ABNORMAL LOW
Hemoglobin: 10.6 — ABNORMAL LOW
Hemoglobin: 10.8 — ABNORMAL LOW
Hemoglobin: 11.7 — ABNORMAL LOW
MCHC: 33.7
MCHC: 34.3
MCV: 92.7
MCV: 93.9
Platelets: 125 — ABNORMAL LOW
Platelets: 130 — ABNORMAL LOW
Platelets: 151
Platelets: 174
Platelets: 182
RBC: 2.96 — ABNORMAL LOW
RBC: 3.08 — ABNORMAL LOW
RBC: 3.38 — ABNORMAL LOW
RBC: 3.42 — ABNORMAL LOW
RDW: 12.8
RDW: 13
RDW: 13.4
WBC: 5.1
WBC: 7.6
WBC: 8.5
WBC: 9.5

## 2011-04-10 LAB — CK TOTAL AND CKMB (NOT AT ARMC)
CK, MB: 3.5
CK, MB: 6.9 — ABNORMAL HIGH
CK, MB: 7.4 — ABNORMAL HIGH
Relative Index: 2.6 — ABNORMAL HIGH
Relative Index: 3.9 — ABNORMAL HIGH
Total CK: 214 — ABNORMAL HIGH
Total CK: 221 — ABNORMAL HIGH

## 2011-04-10 LAB — POCT I-STAT, CHEM 8
Calcium, Ion: 1.14
HCT: 35 — ABNORMAL LOW
Hemoglobin: 11.9 — ABNORMAL LOW
Sodium: 131 — ABNORMAL LOW
TCO2: 25

## 2011-04-10 LAB — TROPONIN I
Troponin I: 1.8
Troponin I: 2.71

## 2011-04-10 LAB — PROTIME-INR
INR: 0.9
INR: 1
Prothrombin Time: 12.6
Prothrombin Time: 13.1

## 2011-04-10 LAB — LIPID PANEL
Cholesterol: 157
HDL: 76
Total CHOL/HDL Ratio: 2.1

## 2011-04-10 LAB — URINALYSIS, ROUTINE W REFLEX MICROSCOPIC
Bilirubin Urine: NEGATIVE
Glucose, UA: 100 — AB
Ketones, ur: NEGATIVE
pH: 5.5

## 2011-04-10 LAB — APTT: aPTT: 73 — ABNORMAL HIGH

## 2011-04-10 LAB — D-DIMER, QUANTITATIVE: D-Dimer, Quant: 0.32

## 2011-04-10 LAB — URINE MICROSCOPIC-ADD ON

## 2011-04-11 LAB — COMPREHENSIVE METABOLIC PANEL
Albumin: 2.6 — ABNORMAL LOW
Alkaline Phosphatase: 92
BUN: 22
Chloride: 105
Creatinine, Ser: 1.28 — ABNORMAL HIGH
Glucose, Bld: 443 — ABNORMAL HIGH
Total Bilirubin: 0.6
Total Protein: 5.8 — ABNORMAL LOW

## 2011-04-11 LAB — BASIC METABOLIC PANEL
BUN: 20
BUN: 21
BUN: 23
CO2: 26
CO2: 26
CO2: 27
Calcium: 9
Calcium: 9.3
Chloride: 106
Chloride: 108
Creatinine, Ser: 1.07
Creatinine, Ser: 1.12
Creatinine, Ser: 1.13
GFR calc non Af Amer: 46 — ABNORMAL LOW
GFR calc non Af Amer: 49 — ABNORMAL LOW
Glucose, Bld: 114 — ABNORMAL HIGH
Glucose, Bld: 128 — ABNORMAL HIGH
Glucose, Bld: 318 — ABNORMAL HIGH
Glucose, Bld: 359 — ABNORMAL HIGH
Potassium: 4.5
Sodium: 140
Sodium: 142

## 2011-04-11 LAB — HEMOGLOBIN A1C
Hgb A1c MFr Bld: 10 — ABNORMAL HIGH
Hgb A1c MFr Bld: 10.3 — ABNORMAL HIGH

## 2011-04-11 LAB — LIPID PANEL
Cholesterol: 184
HDL: 90
Total CHOL/HDL Ratio: 2
Triglycerides: 48

## 2011-04-11 LAB — CBC
HCT: 26.7 — ABNORMAL LOW
HCT: 29.8 — ABNORMAL LOW
HCT: 32.9 — ABNORMAL LOW
Hemoglobin: 10.3 — ABNORMAL LOW
Hemoglobin: 10.9 — ABNORMAL LOW
Hemoglobin: 11.3 — ABNORMAL LOW
Hemoglobin: 9.2 — ABNORMAL LOW
Hemoglobin: 9.9 — ABNORMAL LOW
MCHC: 33.4
MCV: 93.9
MCV: 94.4
Platelets: 181
Platelets: 192
Platelets: 207
RDW: 14.3
RDW: 14.3
RDW: 14.3
RDW: 14.4
WBC: 4.9

## 2011-04-11 LAB — DIFFERENTIAL
Basophils Absolute: 0
Lymphocytes Relative: 28
Monocytes Absolute: 0.4
Neutro Abs: 4.7

## 2011-04-11 LAB — TSH: TSH: 4.605

## 2011-04-11 LAB — HEPARIN LEVEL (UNFRACTIONATED)
Heparin Unfractionated: 0.42
Heparin Unfractionated: 0.84 — ABNORMAL HIGH
Heparin Unfractionated: <0.1 — ABNORMAL LOW

## 2011-04-11 LAB — POCT CARDIAC MARKERS
CKMB, poc: 10.2
Myoglobin, poc: >500
Operator id: 192351
Troponin i, poc: <0.05
Troponin i, poc: <0.05

## 2011-04-11 LAB — T4, FREE: Free T4: 0.82 — ABNORMAL LOW

## 2011-04-11 LAB — CK TOTAL AND CKMB (NOT AT ARMC)
Relative Index: 2.7 — ABNORMAL HIGH
Total CK: 326 — ABNORMAL HIGH
Total CK: 332 — ABNORMAL HIGH
Total CK: 348 — ABNORMAL HIGH

## 2011-04-11 LAB — CORTISOL-AM, BLOOD: Cortisol - AM: 4.2 — ABNORMAL LOW

## 2011-04-11 LAB — GLUCOSE, RANDOM: Glucose, Bld: 399 — ABNORMAL HIGH

## 2011-04-11 LAB — POCT I-STAT, CHEM 8
BUN: 26 — ABNORMAL HIGH
Calcium, Ion: 1.11 — ABNORMAL LOW
Chloride: 107
Potassium: 5.6 — ABNORMAL HIGH
Sodium: 134 — ABNORMAL LOW

## 2011-04-11 LAB — TROPONIN I: Troponin I: 0.05

## 2011-04-12 LAB — CARDIAC PANEL(CRET KIN+CKTOT+MB+TROPI)
CK, MB: 2.2
Total CK: 77
Troponin I: 0.07 — ABNORMAL HIGH

## 2011-04-12 LAB — BASIC METABOLIC PANEL
BUN: 32 — ABNORMAL HIGH
Chloride: 98
Glucose, Bld: 388 — ABNORMAL HIGH
Potassium: 4.8

## 2011-04-12 LAB — CBC
HCT: 31.1 — ABNORMAL LOW
Platelets: 231
WBC: 5.9

## 2011-04-14 LAB — GLUCOSE, CAPILLARY
Glucose-Capillary: 100 — ABNORMAL HIGH
Glucose-Capillary: 123 — ABNORMAL HIGH
Glucose-Capillary: 127 — ABNORMAL HIGH
Glucose-Capillary: 131 — ABNORMAL HIGH
Glucose-Capillary: 138 — ABNORMAL HIGH
Glucose-Capillary: 151 — ABNORMAL HIGH
Glucose-Capillary: 198 — ABNORMAL HIGH
Glucose-Capillary: 248 — ABNORMAL HIGH
Glucose-Capillary: 258 — ABNORMAL HIGH
Glucose-Capillary: 264 — ABNORMAL HIGH
Glucose-Capillary: 323 — ABNORMAL HIGH
Glucose-Capillary: 399 — ABNORMAL HIGH
Glucose-Capillary: 77
Glucose-Capillary: 86

## 2011-04-14 LAB — TSH: TSH: 3.343

## 2011-04-14 LAB — BASIC METABOLIC PANEL
BUN: 50 — ABNORMAL HIGH
CO2: 25
Calcium: 10.2
Calcium: 9.3
Chloride: 104
GFR calc Af Amer: 47 — ABNORMAL LOW
GFR calc non Af Amer: 31 — ABNORMAL LOW
Potassium: 4.6
Sodium: 136

## 2011-04-14 LAB — CARDIAC PANEL(CRET KIN+CKTOT+MB+TROPI)
CK, MB: 4.2 — ABNORMAL HIGH
Total CK: 128
Troponin I: 0.03
Troponin I: 0.04

## 2011-04-14 LAB — BILIRUBIN, FRACTIONATED(TOT/DIR/INDIR)
Bilirubin, Direct: 0.1
Indirect Bilirubin: 0.6
Total Bilirubin: 0.7

## 2011-04-14 LAB — CK TOTAL AND CKMB (NOT AT ARMC)
CK, MB: 4.2 — ABNORMAL HIGH
Relative Index: 3.6 — ABNORMAL HIGH
Total CK: 118

## 2011-04-14 LAB — T3 UPTAKE: T3 Uptake Ratio: 32.9

## 2011-04-14 LAB — CBC
HCT: 35.7 — ABNORMAL LOW
Platelets: 231
WBC: 7

## 2011-04-14 LAB — MAGNESIUM: Magnesium: 2.7 — ABNORMAL HIGH

## 2011-04-14 LAB — T4, FREE: Free T4: 0.98

## 2011-04-14 LAB — PROTIME-INR: Prothrombin Time: 13.2

## 2011-04-18 LAB — BASIC METABOLIC PANEL
BUN: 13
BUN: 17
CO2: 19
CO2: 23
CO2: 26
CO2: 27
CO2: 29
Calcium: 8.8
Calcium: 9
Calcium: 9.3
Chloride: 103
Chloride: 104
Chloride: 105
Chloride: 107
Creatinine, Ser: 1.04
Creatinine, Ser: 1.09
Creatinine, Ser: 1.32 — ABNORMAL HIGH
Creatinine, Ser: 2.65 — ABNORMAL HIGH
GFR calc Af Amer: 21 — ABNORMAL LOW
GFR calc Af Amer: 47 — ABNORMAL LOW
GFR calc Af Amer: >60
GFR calc non Af Amer: 24 — ABNORMAL LOW
GFR calc non Af Amer: 51 — ABNORMAL LOW
GFR calc non Af Amer: 51 — ABNORMAL LOW
Glucose, Bld: 123 — ABNORMAL HIGH
Glucose, Bld: 250 — ABNORMAL HIGH
Potassium: 3.5
Potassium: 4.3
Potassium: 4.6
Potassium: 5.2 — ABNORMAL HIGH
Sodium: 130 — ABNORMAL LOW
Sodium: 136
Sodium: 137
Sodium: 137
Sodium: 139

## 2011-04-18 LAB — CBC
HCT: 29.4 — ABNORMAL LOW
HCT: 31.4 — ABNORMAL LOW
HCT: 32.3 — ABNORMAL LOW
Hemoglobin: 10 — ABNORMAL LOW
Hemoglobin: 11.1 — ABNORMAL LOW
Hemoglobin: 11.5 — ABNORMAL LOW
MCHC: 32.6
MCHC: 34.2
MCHC: 34.5
MCV: 90.8
MCV: 93.1
Platelets: 151
Platelets: 177
RBC: 3.56 — ABNORMAL LOW
RBC: 3.78 — ABNORMAL LOW
RDW: 14
RDW: 14.3
RDW: 14.6
WBC: 11.1 — ABNORMAL HIGH
WBC: 12.1 — ABNORMAL HIGH
WBC: 5.6

## 2011-04-18 LAB — COMPREHENSIVE METABOLIC PANEL
ALT: 17
AST: 35
Albumin: 3 — ABNORMAL LOW
Alkaline Phosphatase: 78
BUN: 57 — ABNORMAL HIGH
CO2: 20
GFR calc Af Amer: 34 — ABNORMAL LOW
Total Bilirubin: 0.5

## 2011-04-18 LAB — GLUCOSE, CAPILLARY
Glucose-Capillary: 101 — ABNORMAL HIGH
Glucose-Capillary: 103 — ABNORMAL HIGH
Glucose-Capillary: 109 — ABNORMAL HIGH
Glucose-Capillary: 111 — ABNORMAL HIGH
Glucose-Capillary: 122 — ABNORMAL HIGH
Glucose-Capillary: 123 — ABNORMAL HIGH
Glucose-Capillary: 130 — ABNORMAL HIGH
Glucose-Capillary: 131 — ABNORMAL HIGH
Glucose-Capillary: 175 — ABNORMAL HIGH
Glucose-Capillary: 175 — ABNORMAL HIGH
Glucose-Capillary: 189 — ABNORMAL HIGH
Glucose-Capillary: 201 — ABNORMAL HIGH
Glucose-Capillary: 216 — ABNORMAL HIGH
Glucose-Capillary: 223 — ABNORMAL HIGH
Glucose-Capillary: 223 — ABNORMAL HIGH
Glucose-Capillary: 234 — ABNORMAL HIGH
Glucose-Capillary: 250 — ABNORMAL HIGH
Glucose-Capillary: 267 — ABNORMAL HIGH
Glucose-Capillary: 370 — ABNORMAL HIGH
Glucose-Capillary: 395 — ABNORMAL HIGH
Glucose-Capillary: 40 — ABNORMAL LOW
Glucose-Capillary: 42 — ABNORMAL LOW
Glucose-Capillary: 45 — ABNORMAL LOW
Glucose-Capillary: 500 — ABNORMAL HIGH
Glucose-Capillary: 58 — ABNORMAL LOW
Glucose-Capillary: 62 — ABNORMAL LOW
Glucose-Capillary: 64 — ABNORMAL LOW
Glucose-Capillary: 76
Glucose-Capillary: 86
Glucose-Capillary: 88
Glucose-Capillary: >600

## 2011-04-18 LAB — RENAL FUNCTION PANEL
BUN: 19
CO2: 24
Chloride: 105
GFR calc Af Amer: 50 — ABNORMAL LOW
GFR calc non Af Amer: 42 — ABNORMAL LOW
Glucose, Bld: 110 — ABNORMAL HIGH
Glucose, Bld: 212 — ABNORMAL HIGH
Phosphorus: 2.2 — ABNORMAL LOW
Phosphorus: 2.3
Potassium: 4
Potassium: 4.7
Sodium: 134 — ABNORMAL LOW
Sodium: 140

## 2011-04-18 LAB — URINE CULTURE

## 2011-04-18 LAB — URINALYSIS, ROUTINE W REFLEX MICROSCOPIC
Bilirubin Urine: NEGATIVE
Glucose, UA: >1000 — AB
Glucose, UA: NEGATIVE
Hgb urine dipstick: NEGATIVE
Ketones, ur: 15 — AB
Ketones, ur: NEGATIVE
Leukocytes, UA: NEGATIVE
Nitrite: NEGATIVE
Specific Gravity, Urine: 1.015
pH: 5.5
pH: 6

## 2011-04-18 LAB — LIPID PANEL
Cholesterol: 178
HDL: 78
LDL Cholesterol: 86
Total CHOL/HDL Ratio: 2.3
Triglycerides: 71
VLDL: 14

## 2011-04-18 LAB — CK TOTAL AND CKMB (NOT AT ARMC)
CK, MB: 5.2 — ABNORMAL HIGH
CK, MB: 6.1 — ABNORMAL HIGH
Relative Index: 2.4
Relative Index: 2.7 — ABNORMAL HIGH
Total CK: 215 — ABNORMAL HIGH
Total CK: 216 — ABNORMAL HIGH
Total CK: 262 — ABNORMAL HIGH

## 2011-04-18 LAB — DIFFERENTIAL
Basophils Relative: 0
Lymphocytes Relative: 15
Lymphs Abs: 1.8
Monocytes Absolute: 1.2 — ABNORMAL HIGH
Monocytes Relative: 10
Neutro Abs: 9 — ABNORMAL HIGH
Neutrophils Relative %: 75

## 2011-04-18 LAB — POCT I-STAT 3, VENOUS BLOOD GAS (G3P V)
Bicarbonate: 20.7
TCO2: 21
pCO2, Ven: 27.6 — ABNORMAL LOW
pH, Ven: 7.482 — ABNORMAL HIGH
pO2, Ven: 44

## 2011-04-18 LAB — HEPARIN LEVEL (UNFRACTIONATED)
Heparin Unfractionated: 0.59
Heparin Unfractionated: 0.84 — ABNORMAL HIGH
Heparin Unfractionated: 0.87 — ABNORMAL HIGH
Heparin Unfractionated: 1.04 — ABNORMAL HIGH

## 2011-04-18 LAB — CARDIAC PANEL(CRET KIN+CKTOT+MB+TROPI)
CK, MB: 5.2 — ABNORMAL HIGH
CK, MB: 7.2 — ABNORMAL HIGH
Relative Index: 1.5
Relative Index: 2.2
Relative Index: 3.1 — ABNORMAL HIGH
Total CK: 158
Total CK: 232 — ABNORMAL HIGH
Troponin I: 0.02
Troponin I: 0.2 — ABNORMAL HIGH
Troponin I: 0.27 — ABNORMAL HIGH
Troponin I: 0.5

## 2011-04-18 LAB — URINE MICROSCOPIC-ADD ON

## 2011-04-18 LAB — KETONES, QUALITATIVE

## 2011-04-18 LAB — HEMOGLOBIN AND HEMATOCRIT, BLOOD
HCT: 31.5 — ABNORMAL LOW
HCT: 31.7 — ABNORMAL LOW
HCT: 32.7 — ABNORMAL LOW
Hemoglobin: 10.3 — ABNORMAL LOW
Hemoglobin: 10.5 — ABNORMAL LOW

## 2011-04-18 LAB — LACTIC ACID, PLASMA: Lactic Acid, Venous: 4.5 — ABNORMAL HIGH

## 2011-04-18 LAB — D-DIMER, QUANTITATIVE: D-Dimer, Quant: 8.51 — ABNORMAL HIGH

## 2011-04-18 LAB — HEMOGLOBIN A1C
Hgb A1c MFr Bld: 11.4 — ABNORMAL HIGH
Mean Plasma Glucose: 280

## 2011-04-18 LAB — POCT CARDIAC MARKERS: Troponin i, poc: 0.3 — ABNORMAL HIGH

## 2011-04-19 LAB — POCT I-STAT, CHEM 8
BUN: 55 — ABNORMAL HIGH
Chloride: 104
Creatinine, Ser: 1.8 — ABNORMAL HIGH
Sodium: 133 — ABNORMAL LOW

## 2011-04-19 LAB — CBC
HCT: 35.8 — ABNORMAL LOW
HCT: 37.7
Hemoglobin: 12.2
MCV: 90.1
MCV: 90.1
MCV: 91.4
Platelets: 197
Platelets: 221
RBC: 3.9
RBC: 4.18
RDW: 13.3
WBC: 5.2
WBC: 5.4
WBC: 6.1

## 2011-04-19 LAB — CARDIAC PANEL(CRET KIN+CKTOT+MB+TROPI)
Relative Index: 3.3 — ABNORMAL HIGH
Troponin I: 0.03

## 2011-04-19 LAB — BASIC METABOLIC PANEL
BUN: 34 — ABNORMAL HIGH
CO2: 27
Chloride: 103
Chloride: 103
Creatinine, Ser: 1.07
Creatinine, Ser: 1.19
GFR calc Af Amer: 53 — ABNORMAL LOW
GFR calc non Af Amer: 44 — ABNORMAL LOW
Potassium: 4.3
Potassium: 4.3

## 2011-04-19 LAB — GLUCOSE, CAPILLARY: Glucose-Capillary: 280 — ABNORMAL HIGH

## 2011-04-19 LAB — POCT CARDIAC MARKERS
CKMB, poc: 2.9
CKMB, poc: 3
CKMB, poc: 3.8
CKMB, poc: 4.6
Myoglobin, poc: 243
Myoglobin, poc: 243
Myoglobin, poc: 262
Myoglobin, poc: 273
Troponin i, poc: <0.05
Troponin i, poc: <0.05

## 2011-04-19 LAB — DIFFERENTIAL
Eosinophils Absolute: 0.1
Lymphocytes Relative: 33
Lymphs Abs: 1.8
Monocytes Relative: 9
Neutro Abs: 3
Neutrophils Relative %: 55

## 2011-04-19 LAB — B-NATRIURETIC PEPTIDE (CONVERTED LAB): Pro B Natriuretic peptide (BNP): 173 — ABNORMAL HIGH

## 2011-04-19 LAB — PROTIME-INR: INR: 1

## 2011-04-19 LAB — TROPONIN I: Troponin I: 0.03

## 2011-04-19 LAB — APTT: aPTT: 28

## 2011-04-21 LAB — BASIC METABOLIC PANEL
BUN: 13 mg/dL (ref 6–23)
BUN: 23 mg/dL (ref 6–23)
BUN: 25 mg/dL — ABNORMAL HIGH (ref 6–23)
BUN: 32 mg/dL — ABNORMAL HIGH (ref 6–23)
BUN: 33 mg/dL — ABNORMAL HIGH (ref 6–23)
BUN: 6 mg/dL (ref 6–23)
BUN: 8 mg/dL (ref 6–23)
CO2: 17 mEq/L — ABNORMAL LOW (ref 19–32)
CO2: 18 mEq/L — ABNORMAL LOW (ref 19–32)
CO2: 19 mEq/L (ref 19–32)
CO2: 19 mEq/L (ref 19–32)
CO2: 22 mEq/L (ref 19–32)
CO2: 23 mEq/L (ref 19–32)
Calcium: 7.7 mg/dL — ABNORMAL LOW (ref 8.4–10.5)
Calcium: 7.8 mg/dL — ABNORMAL LOW (ref 8.4–10.5)
Calcium: 7.9 mg/dL — ABNORMAL LOW (ref 8.4–10.5)
Calcium: 8 mg/dL — ABNORMAL LOW (ref 8.4–10.5)
Calcium: 8 mg/dL — ABNORMAL LOW (ref 8.4–10.5)
Calcium: 8.3 mg/dL — ABNORMAL LOW (ref 8.4–10.5)
Calcium: 8.5 mg/dL (ref 8.4–10.5)
Calcium: 8.7 mg/dL (ref 8.4–10.5)
Calcium: 9.1 mg/dL (ref 8.4–10.5)
Chloride: 108 mEq/L (ref 96–112)
Chloride: 109 mEq/L (ref 96–112)
Chloride: 113 mEq/L — ABNORMAL HIGH (ref 96–112)
Chloride: 115 mEq/L — ABNORMAL HIGH (ref 96–112)
Creatinine, Ser: 0.92 mg/dL (ref 0.4–1.2)
Creatinine, Ser: 0.95 mg/dL (ref 0.4–1.2)
Creatinine, Ser: 0.98 mg/dL (ref 0.4–1.2)
Creatinine, Ser: 1.09 mg/dL (ref 0.4–1.2)
Creatinine, Ser: 1.47 mg/dL — ABNORMAL HIGH (ref 0.4–1.2)
Creatinine, Ser: 1.49 mg/dL — ABNORMAL HIGH (ref 0.4–1.2)
Creatinine, Ser: 1.67 mg/dL — ABNORMAL HIGH (ref 0.4–1.2)
Creatinine, Ser: 1.92 mg/dL — ABNORMAL HIGH (ref 0.4–1.2)
GFR calc Af Amer: 30 mL/min — ABNORMAL LOW (ref >60)
GFR calc Af Amer: 36 mL/min — ABNORMAL LOW (ref >60)
GFR calc Af Amer: 41 mL/min — ABNORMAL LOW (ref >60)
GFR calc Af Amer: 41 mL/min — ABNORMAL LOW (ref >60)
GFR calc Af Amer: 45 mL/min — ABNORMAL LOW (ref >60)
GFR calc Af Amer: 52 mL/min — ABNORMAL LOW (ref >60)
GFR calc Af Amer: >60 mL/min (ref >60)
GFR calc Af Amer: >60 mL/min (ref >60)
GFR calc Af Amer: >60 mL/min (ref >60)
GFR calc non Af Amer: 34 mL/min — ABNORMAL LOW (ref >60)
GFR calc non Af Amer: 41 mL/min — ABNORMAL LOW (ref >60)
GFR calc non Af Amer: 43 mL/min — ABNORMAL LOW (ref >60)
GFR calc non Af Amer: 47 mL/min — ABNORMAL LOW (ref >60)
GFR calc non Af Amer: 48 mL/min — ABNORMAL LOW (ref >60)
GFR calc non Af Amer: 53 mL/min — ABNORMAL LOW (ref >60)
GFR calc non Af Amer: 57 mL/min — ABNORMAL LOW (ref >60)
Glucose, Bld: 142 mg/dL — ABNORMAL HIGH (ref 70–99)
Glucose, Bld: 204 mg/dL — ABNORMAL HIGH (ref 70–99)
Glucose, Bld: 210 mg/dL — ABNORMAL HIGH (ref 70–99)
Glucose, Bld: 268 mg/dL — ABNORMAL HIGH (ref 70–99)
Glucose, Bld: 583 mg/dL (ref 70–99)
Potassium: 3.7 mEq/L (ref 3.5–5.1)
Potassium: 3.7 mEq/L (ref 3.5–5.1)
Potassium: 4.5 mEq/L (ref 3.5–5.1)
Potassium: 4.8 mEq/L (ref 3.5–5.1)
Potassium: 5.4 mEq/L — ABNORMAL HIGH (ref 3.5–5.1)
Potassium: 5.5 mEq/L — ABNORMAL HIGH (ref 3.5–5.1)
Sodium: 127 mEq/L — ABNORMAL LOW (ref 135–145)
Sodium: 131 mEq/L — ABNORMAL LOW (ref 135–145)
Sodium: 135 mEq/L (ref 135–145)
Sodium: 137 mEq/L (ref 135–145)

## 2011-04-21 LAB — CBC
HCT: 25.6 % — ABNORMAL LOW (ref 36.0–46.0)
HCT: 27 % — ABNORMAL LOW (ref 36.0–46.0)
HCT: 28.2 % — ABNORMAL LOW (ref 36.0–46.0)
HCT: 28.3 % — ABNORMAL LOW (ref 36.0–46.0)
HCT: 29 % — ABNORMAL LOW (ref 36.0–46.0)
HCT: 29.4 % — ABNORMAL LOW (ref 36.0–46.0)
HCT: 32.9 % — ABNORMAL LOW (ref 36.0–46.0)
HCT: 34.5 % — ABNORMAL LOW (ref 36.0–46.0)
Hemoglobin: 10.5 g/dL — ABNORMAL LOW (ref 12.0–15.0)
Hemoglobin: 11.1 g/dL — ABNORMAL LOW (ref 12.0–15.0)
Hemoglobin: 12.3 g/dL (ref 12.0–15.0)
Hemoglobin: 8.6 g/dL — ABNORMAL LOW (ref 12.0–15.0)
Hemoglobin: 9.7 g/dL — ABNORMAL LOW (ref 12.0–15.0)
Hemoglobin: 9.7 g/dL — ABNORMAL LOW (ref 12.0–15.0)
Hemoglobin: 9.8 g/dL — ABNORMAL LOW (ref 12.0–15.0)
MCHC: 33.2 g/dL (ref 30.0–36.0)
MCHC: 33.3 g/dL (ref 30.0–36.0)
MCHC: 33.4 g/dL (ref 30.0–36.0)
MCHC: 33.8 g/dL (ref 30.0–36.0)
MCHC: 33.8 g/dL (ref 30.0–36.0)
MCHC: 34.6 g/dL (ref 30.0–36.0)
MCV: 90.5 fL (ref 78.0–100.0)
MCV: 91.2 fL (ref 78.0–100.0)
MCV: 91.2 fL (ref 78.0–100.0)
MCV: 91.9 fL (ref 78.0–100.0)
MCV: 92.7 fL (ref 78.0–100.0)
MCV: 92.8 fL (ref 78.0–100.0)
MCV: 93.1 fL (ref 78.0–100.0)
Platelets: 131 10*3/uL — ABNORMAL LOW (ref 150–400)
Platelets: 143 10*3/uL — ABNORMAL LOW (ref 150–400)
Platelets: 155 10*3/uL (ref 150–400)
Platelets: 163 10*3/uL (ref 150–400)
Platelets: 277 10*3/uL (ref 150–400)
Platelets: 307 10*3/uL (ref 150–400)
Platelets: 313 10*3/uL (ref 150–400)
Platelets: 392 10*3/uL (ref 150–400)
Platelets: 402 10*3/uL — ABNORMAL HIGH (ref 150–400)
Platelets: 452 10*3/uL — ABNORMAL HIGH (ref 150–400)
Platelets: 452 10*3/uL — ABNORMAL HIGH (ref 150–400)
Platelets: 509 10*3/uL — ABNORMAL HIGH (ref 150–400)
RBC: 2.76 MIL/uL — ABNORMAL LOW (ref 3.87–5.11)
RBC: 2.89 MIL/uL — ABNORMAL LOW (ref 3.87–5.11)
RBC: 3.18 MIL/uL — ABNORMAL LOW (ref 3.87–5.11)
RBC: 3.22 MIL/uL — ABNORMAL LOW (ref 3.87–5.11)
RBC: 3.39 MIL/uL — ABNORMAL LOW (ref 3.87–5.11)
RBC: 3.45 MIL/uL — ABNORMAL LOW (ref 3.87–5.11)
RBC: 3.63 MIL/uL — ABNORMAL LOW (ref 3.87–5.11)
RBC: 3.75 MIL/uL — ABNORMAL LOW (ref 3.87–5.11)
RBC: 3.86 MIL/uL — ABNORMAL LOW (ref 3.87–5.11)
RDW: 13.6 % (ref 11.5–15.5)
RDW: 13.6 % (ref 11.5–15.5)
RDW: 13.7 % (ref 11.5–15.5)
RDW: 14 % (ref 11.5–15.5)
RDW: 14 % (ref 11.5–15.5)
RDW: 14 % (ref 11.5–15.5)
RDW: 14.2 % (ref 11.5–15.5)
RDW: 14.3 % (ref 11.5–15.5)
RDW: 14.3 % (ref 11.5–15.5)
RDW: 14.4 % (ref 11.5–15.5)
WBC: 10.4 K/uL (ref 4.0–10.5)
WBC: 11.3 10*3/uL — ABNORMAL HIGH (ref 4.0–10.5)
WBC: 11.4 10*3/uL — ABNORMAL HIGH (ref 4.0–10.5)
WBC: 12.7 10*3/uL — ABNORMAL HIGH (ref 4.0–10.5)
WBC: 12.9 10*3/uL — ABNORMAL HIGH (ref 4.0–10.5)
WBC: 13.3 10*3/uL — ABNORMAL HIGH (ref 4.0–10.5)
WBC: 16 10*3/uL — ABNORMAL HIGH (ref 4.0–10.5)
WBC: 16.2 10*3/uL — ABNORMAL HIGH (ref 4.0–10.5)
WBC: 18 10*3/uL — ABNORMAL HIGH (ref 4.0–10.5)
WBC: 27.3 10*3/uL — ABNORMAL HIGH (ref 4.0–10.5)
WBC: 8.6 10*3/uL (ref 4.0–10.5)

## 2011-04-21 LAB — COMPREHENSIVE METABOLIC PANEL
ALT: 17 U/L (ref 0–35)
ALT: 26 U/L (ref 0–35)
ALT: 28 U/L (ref 0–35)
AST: 46 U/L — ABNORMAL HIGH (ref 0–37)
AST: 46 U/L — ABNORMAL HIGH (ref 0–37)
Albumin: 1.4 g/dL — ABNORMAL LOW (ref 3.5–5.2)
Albumin: 2.1 g/dL — ABNORMAL LOW (ref 3.5–5.2)
Albumin: 2.2 g/dL — ABNORMAL LOW (ref 3.5–5.2)
Albumin: 3.3 g/dL — ABNORMAL LOW (ref 3.5–5.2)
Alkaline Phosphatase: 193 U/L — ABNORMAL HIGH (ref 39–117)
Alkaline Phosphatase: 203 U/L — ABNORMAL HIGH (ref 39–117)
Alkaline Phosphatase: 211 U/L — ABNORMAL HIGH (ref 39–117)
Alkaline Phosphatase: 79 U/L (ref 39–117)
BUN: 20 mg/dL (ref 6–23)
BUN: 25 mg/dL — ABNORMAL HIGH (ref 6–23)
BUN: 29 mg/dL — ABNORMAL HIGH (ref 6–23)
BUN: 30 mg/dL — ABNORMAL HIGH (ref 6–23)
BUN: 5 mg/dL — ABNORMAL LOW (ref 6–23)
CO2: 19 mEq/L (ref 19–32)
CO2: 25 mEq/L (ref 19–32)
Calcium: 7.9 mg/dL — ABNORMAL LOW (ref 8.4–10.5)
Calcium: 9.9 mg/dL (ref 8.4–10.5)
Chloride: 100 mEq/L (ref 96–112)
Chloride: 101 mEq/L (ref 96–112)
Chloride: 103 mEq/L (ref 96–112)
Chloride: 106 mEq/L (ref 96–112)
Chloride: 111 mEq/L (ref 96–112)
Chloride: 98 mEq/L (ref 96–112)
Creatinine, Ser: 0.84 mg/dL (ref 0.4–1.2)
Creatinine, Ser: 1.16 mg/dL (ref 0.4–1.2)
Creatinine, Ser: 1.26 mg/dL — ABNORMAL HIGH (ref 0.4–1.2)
Creatinine, Ser: 1.49 mg/dL — ABNORMAL HIGH (ref 0.4–1.2)
Creatinine, Ser: 1.58 mg/dL — ABNORMAL HIGH (ref 0.4–1.2)
GFR calc Af Amer: 55 mL/min — ABNORMAL LOW (ref >60)
GFR calc non Af Amer: 34 mL/min — ABNORMAL LOW (ref >60)
GFR calc non Af Amer: 45 mL/min — ABNORMAL LOW (ref >60)
GFR calc non Af Amer: >60 mL/min (ref >60)
Glucose, Bld: 107 mg/dL — ABNORMAL HIGH (ref 70–99)
Glucose, Bld: 141 mg/dL — ABNORMAL HIGH (ref 70–99)
Glucose, Bld: 228 mg/dL — ABNORMAL HIGH (ref 70–99)
Potassium: 3.5 mEq/L (ref 3.5–5.1)
Potassium: 3.6 mEq/L (ref 3.5–5.1)
Potassium: 4.5 mEq/L (ref 3.5–5.1)
Sodium: 131 mEq/L — ABNORMAL LOW (ref 135–145)
Sodium: 133 mEq/L — ABNORMAL LOW (ref 135–145)
Sodium: 134 mEq/L — ABNORMAL LOW (ref 135–145)
Total Bilirubin: 0.2 mg/dL — ABNORMAL LOW (ref 0.3–1.2)
Total Bilirubin: 0.2 mg/dL — ABNORMAL LOW (ref 0.3–1.2)
Total Bilirubin: 0.5 mg/dL (ref 0.3–1.2)
Total Bilirubin: 0.7 mg/dL (ref 0.3–1.2)
Total Bilirubin: 0.9 mg/dL (ref 0.3–1.2)
Total Protein: 4.5 g/dL — ABNORMAL LOW (ref 6.0–8.3)
Total Protein: 4.9 g/dL — ABNORMAL LOW (ref 6.0–8.3)
Total Protein: 5 g/dL — ABNORMAL LOW (ref 6.0–8.3)

## 2011-04-21 LAB — GLUCOSE, CAPILLARY
Glucose-Capillary: 100 mg/dL — ABNORMAL HIGH (ref 70–99)
Glucose-Capillary: 105 mg/dL — ABNORMAL HIGH (ref 70–99)
Glucose-Capillary: 106 mg/dL — ABNORMAL HIGH (ref 70–99)
Glucose-Capillary: 107 mg/dL — ABNORMAL HIGH (ref 70–99)
Glucose-Capillary: 108 mg/dL — ABNORMAL HIGH (ref 70–99)
Glucose-Capillary: 112 mg/dL — ABNORMAL HIGH (ref 70–99)
Glucose-Capillary: 113 mg/dL — ABNORMAL HIGH (ref 70–99)
Glucose-Capillary: 118 mg/dL — ABNORMAL HIGH (ref 70–99)
Glucose-Capillary: 122 mg/dL — ABNORMAL HIGH (ref 70–99)
Glucose-Capillary: 125 mg/dL — ABNORMAL HIGH (ref 70–99)
Glucose-Capillary: 126 mg/dL — ABNORMAL HIGH (ref 70–99)
Glucose-Capillary: 128 mg/dL — ABNORMAL HIGH (ref 70–99)
Glucose-Capillary: 131 mg/dL — ABNORMAL HIGH (ref 70–99)
Glucose-Capillary: 133 mg/dL — ABNORMAL HIGH (ref 70–99)
Glucose-Capillary: 134 mg/dL — ABNORMAL HIGH (ref 70–99)
Glucose-Capillary: 136 mg/dL — ABNORMAL HIGH (ref 70–99)
Glucose-Capillary: 136 mg/dL — ABNORMAL HIGH (ref 70–99)
Glucose-Capillary: 137 mg/dL — ABNORMAL HIGH (ref 70–99)
Glucose-Capillary: 137 mg/dL — ABNORMAL HIGH (ref 70–99)
Glucose-Capillary: 139 mg/dL — ABNORMAL HIGH (ref 70–99)
Glucose-Capillary: 140 mg/dL — ABNORMAL HIGH (ref 70–99)
Glucose-Capillary: 141 mg/dL — ABNORMAL HIGH (ref 70–99)
Glucose-Capillary: 144 mg/dL — ABNORMAL HIGH (ref 70–99)
Glucose-Capillary: 151 mg/dL — ABNORMAL HIGH (ref 70–99)
Glucose-Capillary: 153 mg/dL — ABNORMAL HIGH (ref 70–99)
Glucose-Capillary: 153 mg/dL — ABNORMAL HIGH (ref 70–99)
Glucose-Capillary: 154 mg/dL — ABNORMAL HIGH (ref 70–99)
Glucose-Capillary: 157 mg/dL — ABNORMAL HIGH (ref 70–99)
Glucose-Capillary: 157 mg/dL — ABNORMAL HIGH (ref 70–99)
Glucose-Capillary: 158 mg/dL — ABNORMAL HIGH (ref 70–99)
Glucose-Capillary: 160 mg/dL — ABNORMAL HIGH (ref 70–99)
Glucose-Capillary: 160 mg/dL — ABNORMAL HIGH (ref 70–99)
Glucose-Capillary: 162 mg/dL — ABNORMAL HIGH (ref 70–99)
Glucose-Capillary: 163 mg/dL — ABNORMAL HIGH (ref 70–99)
Glucose-Capillary: 164 mg/dL — ABNORMAL HIGH (ref 70–99)
Glucose-Capillary: 165 mg/dL — ABNORMAL HIGH (ref 70–99)
Glucose-Capillary: 165 mg/dL — ABNORMAL HIGH (ref 70–99)
Glucose-Capillary: 166 mg/dL — ABNORMAL HIGH (ref 70–99)
Glucose-Capillary: 168 mg/dL — ABNORMAL HIGH (ref 70–99)
Glucose-Capillary: 168 mg/dL — ABNORMAL HIGH (ref 70–99)
Glucose-Capillary: 168 mg/dL — ABNORMAL HIGH (ref 70–99)
Glucose-Capillary: 169 mg/dL — ABNORMAL HIGH (ref 70–99)
Glucose-Capillary: 173 mg/dL — ABNORMAL HIGH (ref 70–99)
Glucose-Capillary: 173 mg/dL — ABNORMAL HIGH (ref 70–99)
Glucose-Capillary: 176 mg/dL — ABNORMAL HIGH (ref 70–99)
Glucose-Capillary: 177 mg/dL — ABNORMAL HIGH (ref 70–99)
Glucose-Capillary: 18 mg/dL — CL (ref 70–99)
Glucose-Capillary: 180 mg/dL — ABNORMAL HIGH (ref 70–99)
Glucose-Capillary: 184 mg/dL — ABNORMAL HIGH (ref 70–99)
Glucose-Capillary: 186 mg/dL — ABNORMAL HIGH (ref 70–99)
Glucose-Capillary: 190 mg/dL — ABNORMAL HIGH (ref 70–99)
Glucose-Capillary: 192 mg/dL — ABNORMAL HIGH (ref 70–99)
Glucose-Capillary: 192 mg/dL — ABNORMAL HIGH (ref 70–99)
Glucose-Capillary: 197 mg/dL — ABNORMAL HIGH (ref 70–99)
Glucose-Capillary: 197 mg/dL — ABNORMAL HIGH (ref 70–99)
Glucose-Capillary: 198 mg/dL — ABNORMAL HIGH (ref 70–99)
Glucose-Capillary: 198 mg/dL — ABNORMAL HIGH (ref 70–99)
Glucose-Capillary: 199 mg/dL — ABNORMAL HIGH (ref 70–99)
Glucose-Capillary: 202 mg/dL — ABNORMAL HIGH (ref 70–99)
Glucose-Capillary: 203 mg/dL — ABNORMAL HIGH (ref 70–99)
Glucose-Capillary: 208 mg/dL — ABNORMAL HIGH (ref 70–99)
Glucose-Capillary: 208 mg/dL — ABNORMAL HIGH (ref 70–99)
Glucose-Capillary: 208 mg/dL — ABNORMAL HIGH (ref 70–99)
Glucose-Capillary: 209 mg/dL — ABNORMAL HIGH (ref 70–99)
Glucose-Capillary: 211 mg/dL — ABNORMAL HIGH (ref 70–99)
Glucose-Capillary: 212 mg/dL — ABNORMAL HIGH (ref 70–99)
Glucose-Capillary: 213 mg/dL — ABNORMAL HIGH (ref 70–99)
Glucose-Capillary: 214 mg/dL — ABNORMAL HIGH (ref 70–99)
Glucose-Capillary: 216 mg/dL — ABNORMAL HIGH (ref 70–99)
Glucose-Capillary: 217 mg/dL — ABNORMAL HIGH (ref 70–99)
Glucose-Capillary: 217 mg/dL — ABNORMAL HIGH (ref 70–99)
Glucose-Capillary: 218 mg/dL — ABNORMAL HIGH (ref 70–99)
Glucose-Capillary: 219 mg/dL — ABNORMAL HIGH (ref 70–99)
Glucose-Capillary: 222 mg/dL — ABNORMAL HIGH (ref 70–99)
Glucose-Capillary: 223 mg/dL — ABNORMAL HIGH (ref 70–99)
Glucose-Capillary: 224 mg/dL — ABNORMAL HIGH (ref 70–99)
Glucose-Capillary: 230 mg/dL — ABNORMAL HIGH (ref 70–99)
Glucose-Capillary: 246 mg/dL — ABNORMAL HIGH (ref 70–99)
Glucose-Capillary: 252 mg/dL — ABNORMAL HIGH (ref 70–99)
Glucose-Capillary: 253 mg/dL — ABNORMAL HIGH (ref 70–99)
Glucose-Capillary: 275 mg/dL — ABNORMAL HIGH (ref 70–99)
Glucose-Capillary: 280 mg/dL — ABNORMAL HIGH (ref 70–99)
Glucose-Capillary: 288 mg/dL — ABNORMAL HIGH (ref 70–99)
Glucose-Capillary: 293 mg/dL — ABNORMAL HIGH (ref 70–99)
Glucose-Capillary: 319 mg/dL — ABNORMAL HIGH (ref 70–99)
Glucose-Capillary: 346 mg/dL — ABNORMAL HIGH (ref 70–99)
Glucose-Capillary: 352 mg/dL — ABNORMAL HIGH (ref 70–99)
Glucose-Capillary: 381 mg/dL — ABNORMAL HIGH (ref 70–99)
Glucose-Capillary: 42 mg/dL — ABNORMAL LOW (ref 70–99)
Glucose-Capillary: 431 mg/dL — ABNORMAL HIGH (ref 70–99)
Glucose-Capillary: 444 mg/dL — ABNORMAL HIGH (ref 70–99)
Glucose-Capillary: 481 mg/dL — ABNORMAL HIGH (ref 70–99)
Glucose-Capillary: 485 mg/dL — ABNORMAL HIGH (ref 70–99)
Glucose-Capillary: 510 mg/dL (ref 70–99)
Glucose-Capillary: 514 mg/dL (ref 70–99)
Glucose-Capillary: 545 mg/dL (ref 70–99)
Glucose-Capillary: 55 mg/dL — ABNORMAL LOW (ref 70–99)
Glucose-Capillary: 60 mg/dL — ABNORMAL LOW (ref 70–99)
Glucose-Capillary: 62 mg/dL — ABNORMAL LOW (ref 70–99)
Glucose-Capillary: 65 mg/dL — ABNORMAL LOW (ref 70–99)
Glucose-Capillary: 65 mg/dL — ABNORMAL LOW (ref 70–99)
Glucose-Capillary: 66 mg/dL — ABNORMAL LOW (ref 70–99)
Glucose-Capillary: 68 mg/dL — ABNORMAL LOW (ref 70–99)
Glucose-Capillary: 71 mg/dL (ref 70–99)
Glucose-Capillary: 72 mg/dL (ref 70–99)
Glucose-Capillary: 75 mg/dL (ref 70–99)
Glucose-Capillary: 75 mg/dL (ref 70–99)
Glucose-Capillary: 78 mg/dL (ref 70–99)
Glucose-Capillary: 80 mg/dL (ref 70–99)
Glucose-Capillary: 82 mg/dL (ref 70–99)
Glucose-Capillary: 82 mg/dL (ref 70–99)
Glucose-Capillary: 85 mg/dL (ref 70–99)
Glucose-Capillary: 85 mg/dL (ref 70–99)
Glucose-Capillary: 87 mg/dL (ref 70–99)
Glucose-Capillary: 87 mg/dL (ref 70–99)
Glucose-Capillary: 91 mg/dL (ref 70–99)
Glucose-Capillary: 92 mg/dL (ref 70–99)
Glucose-Capillary: 94 mg/dL (ref 70–99)
Glucose-Capillary: 96 mg/dL (ref 70–99)
Glucose-Capillary: 97 mg/dL (ref 70–99)
Glucose-Capillary: 97 mg/dL (ref 70–99)
Glucose-Capillary: 97 mg/dL (ref 70–99)
Glucose-Capillary: 97 mg/dL (ref 70–99)

## 2011-04-21 LAB — CROSSMATCH: ABO/RH(D): A POS

## 2011-04-21 LAB — CULTURE, BLOOD (ROUTINE X 2)
Culture: NO GROWTH
Culture: NO GROWTH

## 2011-04-21 LAB — LIPASE, BLOOD: Lipase: 14 U/L (ref 11–59)

## 2011-04-21 LAB — URINALYSIS, ROUTINE W REFLEX MICROSCOPIC
Bilirubin Urine: NEGATIVE
Glucose, UA: >1000 mg/dL — AB
Ketones, ur: 15 mg/dL — AB
Protein, ur: 100 mg/dL — AB
pH: 5 (ref 5.0–8.0)

## 2011-04-21 LAB — HEMOGLOBIN A1C
Hgb A1c MFr Bld: 10.7 % — ABNORMAL HIGH (ref 4.6–6.1)
Mean Plasma Glucose: 260 mg/dL

## 2011-04-21 LAB — BASIC METABOLIC PANEL WITH GFR
BUN: 34 mg/dL — ABNORMAL HIGH (ref 6–23)
Calcium: 8.9 mg/dL (ref 8.4–10.5)
Chloride: 106 meq/L (ref 96–112)
GFR calc non Af Amer: 30 mL/min — ABNORMAL LOW (ref >60)
GFR calc non Af Amer: 34 mL/min — ABNORMAL LOW (ref >60)
Glucose, Bld: 315 mg/dL — ABNORMAL HIGH (ref 70–99)
Glucose, Bld: 542 mg/dL (ref 70–99)
Potassium: 4.1 meq/L (ref 3.5–5.1)
Sodium: 140 meq/L (ref 135–145)
Sodium: 140 meq/L (ref 135–145)

## 2011-04-21 LAB — DIFFERENTIAL
Basophils Absolute: 0 10*3/uL (ref 0.0–0.1)
Basophils Absolute: 0.1 10*3/uL (ref 0.0–0.1)
Eosinophils Absolute: 0.1 10*3/uL (ref 0.0–0.7)
Eosinophils Relative: 1 % (ref 0–5)
Eosinophils Relative: 1 % (ref 0–5)
Lymphocytes Relative: 10 % — ABNORMAL LOW (ref 12–46)
Lymphocytes Relative: 18 % (ref 12–46)
Lymphocytes Relative: 21 % (ref 12–46)
Lymphs Abs: 3.4 10*3/uL (ref 0.7–4.0)
Monocytes Absolute: 0.6 10*3/uL (ref 0.1–1.0)
Monocytes Relative: 4 % (ref 3–12)
Neutro Abs: 11.8 10*3/uL — ABNORMAL HIGH (ref 1.7–7.7)
Neutro Abs: 9.6 10*3/uL — ABNORMAL HIGH (ref 1.7–7.7)
Neutrophils Relative %: 74 % (ref 43–77)
Neutrophils Relative %: 77 % (ref 43–77)

## 2011-04-21 LAB — HEMOGLOBINOPATHY EVALUATION
Hemoglobin Other: 0 % (ref 0.0–0.0)
Hgb A: 96.1 % — ABNORMAL LOW (ref 96.8–97.8)

## 2011-04-21 LAB — LIPID PANEL
Cholesterol: 154 mg/dL (ref 0–200)
HDL: 76 mg/dL (ref >39)
LDL Cholesterol: 58 mg/dL (ref 0–99)
Total CHOL/HDL Ratio: 2 ratio
Triglycerides: 98 mg/dL (ref <150)
VLDL: 20 mg/dL (ref 0–40)

## 2011-04-21 LAB — CARDIAC PANEL(CRET KIN+CKTOT+MB+TROPI)
CK, MB: 2.7 ng/mL (ref 0.3–4.0)
Relative Index: 0.9 (ref 0.0–2.5)
Relative Index: INVALID (ref 0.0–2.5)
Relative Index: INVALID (ref 0.0–2.5)
Total CK: 288 U/L — ABNORMAL HIGH (ref 7–177)
Total CK: 87 U/L (ref 7–177)
Troponin I: 0.01 ng/mL (ref 0.00–0.06)
Troponin I: 0.03 ng/mL (ref 0.00–0.06)

## 2011-04-21 LAB — HEPATIC FUNCTION PANEL
Albumin: 1.6 g/dL — ABNORMAL LOW (ref 3.5–5.2)
Alkaline Phosphatase: 251 U/L — ABNORMAL HIGH (ref 39–117)
Total Protein: 5.1 g/dL — ABNORMAL LOW (ref 6.0–8.3)

## 2011-04-21 LAB — POCT CARDIAC MARKERS
CKMB, poc: 2.9 ng/mL (ref 1.0–8.0)
Myoglobin, poc: >500 ng/mL (ref 12–200)
Troponin i, poc: <0.05 ng/mL (ref 0.00–0.09)

## 2011-04-21 LAB — PROTIME-INR: Prothrombin Time: 14.6 seconds (ref 11.6–15.2)

## 2011-04-21 LAB — TSH: TSH: 1.75 u[IU]/mL (ref 0.350–4.500)

## 2011-04-21 LAB — APTT: aPTT: 27 seconds (ref 24–37)

## 2011-04-21 LAB — URINE CULTURE: Colony Count: >=100000

## 2011-04-21 LAB — MAGNESIUM
Magnesium: 1.7 mg/dL (ref 1.5–2.5)
Magnesium: 1.8 mg/dL (ref 1.5–2.5)

## 2011-04-21 LAB — VITAMIN B12: Vitamin B-12: 341 pg/mL (ref 211–911)

## 2011-04-21 LAB — ABO/RH: ABO/RH(D): A POS

## 2011-04-21 LAB — IRON AND TIBC
Saturation Ratios: 7 % — ABNORMAL LOW (ref 20–55)
UIBC: 127 ug/dL

## 2011-04-21 LAB — B-NATRIURETIC PEPTIDE (CONVERTED LAB)
Pro B Natriuretic peptide (BNP): 442 pg/mL — ABNORMAL HIGH (ref 0.0–100.0)
Pro B Natriuretic peptide (BNP): 530 pg/mL — ABNORMAL HIGH (ref 0.0–100.0)

## 2011-04-21 LAB — URINE MICROSCOPIC-ADD ON

## 2011-04-21 LAB — CK TOTAL AND CKMB (NOT AT ARMC)
CK, MB: 1.3 ng/mL (ref 0.3–4.0)
Relative Index: INVALID (ref 0.0–2.5)
Total CK: 90 U/L (ref 7–177)

## 2011-04-21 LAB — TROPONIN I: Troponin I: 0.06 ng/mL (ref 0.00–0.06)

## 2011-04-21 LAB — BODY FLUID CULTURE
Culture: NO GROWTH
Gram Stain: NONE SEEN

## 2011-04-21 LAB — FERRITIN: Ferritin: 722 ng/mL — ABNORMAL HIGH (ref 10–291)

## 2011-04-21 LAB — FOLATE RBC: RBC Folate: 861 ng/mL — ABNORMAL HIGH (ref 180–600)

## 2011-04-21 LAB — AMYLASE: Amylase: 25 U/L — ABNORMAL LOW (ref 27–131)

## 2011-04-24 LAB — CBC
HCT: 35.1 — ABNORMAL LOW
Hemoglobin: 11.8 — ABNORMAL LOW
MCHC: 33.7
MCV: 94.3
Platelets: 196
RBC: 3.72 — ABNORMAL LOW
RDW: 13.5
WBC: 6.7

## 2011-04-24 LAB — COMPREHENSIVE METABOLIC PANEL
ALT: 17
AST: 23
Albumin: 3.4 — ABNORMAL LOW
Alkaline Phosphatase: 58
Chloride: 103
GFR calc Af Amer: 53 — ABNORMAL LOW
Potassium: 5.3 — ABNORMAL HIGH
Sodium: 140
Total Bilirubin: 0.5
Total Protein: 7.1

## 2017-05-30 ENCOUNTER — Encounter: Payer: Self-pay | Admitting: *Deleted

## 2017-05-30 NOTE — Telephone Encounter (Signed)
This encounter was created in error - please disregard.
